# Patient Record
Sex: Female | Born: 1965 | Hispanic: No | Marital: Married | Smoking: Never smoker
Health system: Southern US, Community
[De-identification: ages and names within clinical notes are randomized; demographics above are authoritative.]

## PROBLEM LIST (undated history)

## (undated) DIAGNOSIS — R51 Headache: Secondary | ICD-10-CM

## (undated) DIAGNOSIS — Z8619 Personal history of other infectious and parasitic diseases: Secondary | ICD-10-CM

## (undated) DIAGNOSIS — N90810 Female genital mutilation status, unspecified: Secondary | ICD-10-CM

## (undated) DIAGNOSIS — E785 Hyperlipidemia, unspecified: Secondary | ICD-10-CM

## (undated) DIAGNOSIS — K436 Other and unspecified ventral hernia with obstruction, without gangrene: Secondary | ICD-10-CM

## (undated) DIAGNOSIS — Z9889 Other specified postprocedural states: Secondary | ICD-10-CM

## (undated) DIAGNOSIS — R112 Nausea with vomiting, unspecified: Secondary | ICD-10-CM

## (undated) DIAGNOSIS — I10 Essential (primary) hypertension: Secondary | ICD-10-CM

## (undated) DIAGNOSIS — K5900 Constipation, unspecified: Secondary | ICD-10-CM

## (undated) HISTORY — DX: Personal history of other infectious and parasitic diseases: Z86.19

## (undated) HISTORY — PX: ANAL FISSURE REPAIR: SHX2312

## (undated) HISTORY — DX: Other and unspecified ventral hernia with obstruction, without gangrene: K43.6

## (undated) HISTORY — DX: Female genital mutilation status, unspecified: N90.810

## (undated) HISTORY — DX: Essential (primary) hypertension: I10

---

## 1985-04-30 HISTORY — PX: TONSILLECTOMY AND ADENOIDECTOMY: SUR1326

## 1997-06-25 ENCOUNTER — Ambulatory Visit (HOSPITAL_COMMUNITY): Admission: RE | Admit: 1997-06-25 | Discharge: 1997-06-25 | Payer: Self-pay | Admitting: Obstetrics and Gynecology

## 1998-08-29 ENCOUNTER — Other Ambulatory Visit: Admission: RE | Admit: 1998-08-29 | Discharge: 1998-08-29 | Payer: Self-pay | Admitting: Obstetrics and Gynecology

## 1999-01-12 ENCOUNTER — Emergency Department (HOSPITAL_COMMUNITY): Admission: EM | Admit: 1999-01-12 | Discharge: 1999-01-12 | Payer: Self-pay | Admitting: Emergency Medicine

## 1999-01-12 ENCOUNTER — Encounter: Payer: Self-pay | Admitting: Emergency Medicine

## 1999-07-19 ENCOUNTER — Ambulatory Visit (HOSPITAL_COMMUNITY): Admission: RE | Admit: 1999-07-19 | Discharge: 1999-07-19 | Payer: Self-pay | Admitting: Obstetrics and Gynecology

## 1999-07-19 ENCOUNTER — Encounter: Payer: Self-pay | Admitting: Obstetrics and Gynecology

## 1999-09-05 ENCOUNTER — Encounter: Admission: RE | Admit: 1999-09-05 | Discharge: 1999-09-05 | Payer: Self-pay | Admitting: Family Medicine

## 1999-09-18 ENCOUNTER — Encounter: Admission: RE | Admit: 1999-09-18 | Discharge: 1999-09-18 | Payer: Self-pay | Admitting: Family Medicine

## 2000-03-01 ENCOUNTER — Other Ambulatory Visit: Admission: RE | Admit: 2000-03-01 | Discharge: 2000-03-01 | Payer: Self-pay | Admitting: Obstetrics and Gynecology

## 2000-10-14 ENCOUNTER — Inpatient Hospital Stay (HOSPITAL_COMMUNITY): Admission: AD | Admit: 2000-10-14 | Discharge: 2000-10-14 | Payer: Self-pay | Admitting: Gynecology

## 2000-10-15 ENCOUNTER — Inpatient Hospital Stay (HOSPITAL_COMMUNITY): Admission: AD | Admit: 2000-10-15 | Discharge: 2000-10-20 | Payer: Self-pay | Admitting: *Deleted

## 2000-10-15 ENCOUNTER — Encounter (INDEPENDENT_AMBULATORY_CARE_PROVIDER_SITE_OTHER): Payer: Self-pay

## 2000-11-26 ENCOUNTER — Other Ambulatory Visit: Admission: RE | Admit: 2000-11-26 | Discharge: 2000-11-26 | Payer: Self-pay | Admitting: Gynecology

## 2001-01-20 ENCOUNTER — Other Ambulatory Visit: Admission: RE | Admit: 2001-01-20 | Discharge: 2001-01-20 | Payer: Self-pay | Admitting: Gynecology

## 2002-08-26 ENCOUNTER — Ambulatory Visit (HOSPITAL_COMMUNITY): Admission: RE | Admit: 2002-08-26 | Discharge: 2002-08-26 | Payer: Self-pay | Admitting: Obstetrics and Gynecology

## 2002-08-26 ENCOUNTER — Encounter: Payer: Self-pay | Admitting: Obstetrics and Gynecology

## 2002-12-01 ENCOUNTER — Other Ambulatory Visit: Admission: RE | Admit: 2002-12-01 | Discharge: 2002-12-01 | Payer: Self-pay | Admitting: Obstetrics and Gynecology

## 2003-03-30 ENCOUNTER — Ambulatory Visit (HOSPITAL_COMMUNITY): Admission: RE | Admit: 2003-03-30 | Discharge: 2003-03-30 | Payer: Self-pay | Admitting: Family Medicine

## 2003-05-03 ENCOUNTER — Encounter: Payer: Self-pay | Admitting: Gastroenterology

## 2003-06-16 ENCOUNTER — Other Ambulatory Visit: Admission: RE | Admit: 2003-06-16 | Discharge: 2003-06-16 | Payer: Self-pay | Admitting: Gynecology

## 2003-10-05 ENCOUNTER — Encounter: Admission: RE | Admit: 2003-10-05 | Discharge: 2004-01-03 | Payer: Self-pay | Admitting: Gynecology

## 2004-01-12 ENCOUNTER — Inpatient Hospital Stay (HOSPITAL_COMMUNITY): Admission: AD | Admit: 2004-01-12 | Discharge: 2004-01-16 | Payer: Self-pay | Admitting: Gynecology

## 2004-02-28 ENCOUNTER — Other Ambulatory Visit: Admission: RE | Admit: 2004-02-28 | Discharge: 2004-02-28 | Payer: Self-pay | Admitting: Gynecology

## 2005-03-01 ENCOUNTER — Other Ambulatory Visit: Admission: RE | Admit: 2005-03-01 | Discharge: 2005-03-01 | Payer: Self-pay | Admitting: Obstetrics and Gynecology

## 2006-03-14 ENCOUNTER — Other Ambulatory Visit: Admission: RE | Admit: 2006-03-14 | Discharge: 2006-03-14 | Payer: Self-pay | Admitting: Obstetrics and Gynecology

## 2006-12-10 ENCOUNTER — Encounter: Admission: RE | Admit: 2006-12-10 | Discharge: 2006-12-10 | Payer: Self-pay | Admitting: Surgery

## 2006-12-26 ENCOUNTER — Ambulatory Visit: Payer: Self-pay | Admitting: Gastroenterology

## 2007-07-10 ENCOUNTER — Other Ambulatory Visit: Admission: RE | Admit: 2007-07-10 | Discharge: 2007-07-10 | Payer: Self-pay | Admitting: Obstetrics and Gynecology

## 2007-07-29 DIAGNOSIS — K222 Esophageal obstruction: Secondary | ICD-10-CM | POA: Insufficient documentation

## 2007-07-29 DIAGNOSIS — K219 Gastro-esophageal reflux disease without esophagitis: Secondary | ICD-10-CM | POA: Insufficient documentation

## 2007-08-28 ENCOUNTER — Telehealth: Payer: Self-pay | Admitting: Gastroenterology

## 2007-12-22 ENCOUNTER — Telehealth: Payer: Self-pay | Admitting: Gastroenterology

## 2007-12-22 ENCOUNTER — Ambulatory Visit: Payer: Self-pay | Admitting: Internal Medicine

## 2007-12-22 DIAGNOSIS — R1012 Left upper quadrant pain: Secondary | ICD-10-CM | POA: Insufficient documentation

## 2007-12-23 ENCOUNTER — Telehealth (INDEPENDENT_AMBULATORY_CARE_PROVIDER_SITE_OTHER): Payer: Self-pay

## 2007-12-23 LAB — CONVERTED CEMR LAB
AST: 16 units/L (ref 0–37)
Albumin: 3.8 g/dL (ref 3.5–5.2)
Amylase: 51 units/L (ref 27–131)
BUN: 11 mg/dL (ref 6–23)
Basophils Relative: 1.1 % (ref 0.0–3.0)
Calcium: 8.7 mg/dL (ref 8.4–10.5)
Chloride: 110 meq/L (ref 96–112)
Creatinine, Ser: 0.6 mg/dL (ref 0.4–1.2)
Eosinophils Absolute: 0 10*3/uL (ref 0.0–0.7)
Eosinophils Relative: 0.8 % (ref 0.0–5.0)
GFR calc non Af Amer: 115 mL/min
Glucose, Bld: 101 mg/dL — ABNORMAL HIGH (ref 70–99)
HCT: 34.7 % — ABNORMAL LOW (ref 36.0–46.0)
Lipase: 23 units/L (ref 11.0–59.0)
MCV: 83.5 fL (ref 78.0–100.0)
Monocytes Absolute: 0.4 10*3/uL (ref 0.1–1.0)
Monocytes Relative: 6.4 % (ref 3.0–12.0)
Potassium: 4 meq/L (ref 3.5–5.1)
RBC: 4.16 M/uL (ref 3.87–5.11)
WBC: 5.7 10*3/uL (ref 4.5–10.5)

## 2008-01-09 ENCOUNTER — Ambulatory Visit: Payer: Self-pay | Admitting: Internal Medicine

## 2008-01-11 LAB — CONVERTED CEMR LAB
Ferritin: 45.1 ng/mL (ref 10.0–291.0)
Iron: 52 ug/dL (ref 42–145)

## 2008-01-13 ENCOUNTER — Ambulatory Visit: Payer: Self-pay | Admitting: Gastroenterology

## 2008-01-13 LAB — CONVERTED CEMR LAB
CRP, High Sensitivity: 6 — ABNORMAL HIGH (ref 0.00–5.00)
Sed Rate: 21 mm/hr (ref 0–22)
Tissue Transglutaminase Ab, IgA: 0.4 units (ref <7)

## 2008-01-26 ENCOUNTER — Ambulatory Visit (HOSPITAL_COMMUNITY): Admission: RE | Admit: 2008-01-26 | Discharge: 2008-01-26 | Payer: Self-pay | Admitting: Gastroenterology

## 2008-01-26 DIAGNOSIS — N1330 Unspecified hydronephrosis: Secondary | ICD-10-CM | POA: Insufficient documentation

## 2008-02-02 ENCOUNTER — Encounter: Payer: Self-pay | Admitting: Gastroenterology

## 2008-02-10 ENCOUNTER — Encounter: Payer: Self-pay | Admitting: Gastroenterology

## 2008-07-15 ENCOUNTER — Ambulatory Visit: Payer: Self-pay | Admitting: Obstetrics and Gynecology

## 2008-07-15 ENCOUNTER — Other Ambulatory Visit: Admission: RE | Admit: 2008-07-15 | Discharge: 2008-07-15 | Payer: Self-pay | Admitting: Obstetrics and Gynecology

## 2008-07-15 ENCOUNTER — Encounter: Payer: Self-pay | Admitting: Obstetrics and Gynecology

## 2008-11-04 ENCOUNTER — Ambulatory Visit: Payer: Self-pay | Admitting: Obstetrics and Gynecology

## 2008-11-08 ENCOUNTER — Encounter: Admission: RE | Admit: 2008-11-08 | Discharge: 2008-11-08 | Payer: Self-pay | Admitting: Obstetrics and Gynecology

## 2009-08-17 ENCOUNTER — Emergency Department (HOSPITAL_COMMUNITY): Admission: EM | Admit: 2009-08-17 | Discharge: 2009-08-17 | Payer: Self-pay | Admitting: Family Medicine

## 2009-08-25 ENCOUNTER — Ambulatory Visit: Payer: Self-pay | Admitting: Internal Medicine

## 2009-08-25 ENCOUNTER — Encounter (INDEPENDENT_AMBULATORY_CARE_PROVIDER_SITE_OTHER): Payer: Self-pay | Admitting: Family Medicine

## 2009-08-25 LAB — CONVERTED CEMR LAB
Cortisol, Plasma: 9.5 ug/dL
Helicobacter Pylori Antibody-IgG: 0.5
Vit D, 25-Hydroxy: 12 ng/mL — ABNORMAL LOW (ref 30–89)

## 2009-09-13 ENCOUNTER — Ambulatory Visit: Payer: Self-pay | Admitting: Obstetrics and Gynecology

## 2009-09-13 ENCOUNTER — Other Ambulatory Visit: Admission: RE | Admit: 2009-09-13 | Discharge: 2009-09-13 | Payer: Self-pay | Admitting: Obstetrics and Gynecology

## 2009-10-04 ENCOUNTER — Encounter (INDEPENDENT_AMBULATORY_CARE_PROVIDER_SITE_OTHER): Payer: Self-pay | Admitting: Family Medicine

## 2009-10-04 ENCOUNTER — Ambulatory Visit: Payer: Self-pay | Admitting: Internal Medicine

## 2009-10-04 LAB — CONVERTED CEMR LAB
AST: 13 units/L (ref 0–37)
Albumin: 4.5 g/dL (ref 3.5–5.2)
Alkaline Phosphatase: 68 units/L (ref 39–117)
BUN: 12 mg/dL (ref 6–23)
Basophils Absolute: 0 10*3/uL (ref 0.0–0.1)
Basophils Relative: 1 % (ref 0–1)
Creatinine, Ser: 0.5 mg/dL (ref 0.40–1.20)
Eosinophils Relative: 2 % (ref 0–5)
HCT: 38.6 % (ref 36.0–46.0)
Lymphocytes Relative: 37 % (ref 12–46)
Neutro Abs: 2.9 10*3/uL (ref 1.7–7.7)
Platelets: 294 10*3/uL (ref 150–400)
Potassium: 4.1 meq/L (ref 3.5–5.3)
RDW: 14 % (ref 11.5–15.5)
Total Bilirubin: 0.3 mg/dL (ref 0.3–1.2)

## 2009-10-13 ENCOUNTER — Emergency Department (HOSPITAL_COMMUNITY): Admission: EM | Admit: 2009-10-13 | Discharge: 2009-10-13 | Payer: Self-pay | Admitting: Family Medicine

## 2010-05-21 ENCOUNTER — Encounter: Payer: Self-pay | Admitting: Surgery

## 2010-07-18 LAB — COMPREHENSIVE METABOLIC PANEL
ALT: 15 U/L (ref 0–35)
AST: 20 U/L (ref 0–37)
Alkaline Phosphatase: 73 U/L (ref 39–117)
CO2: 28 mEq/L (ref 19–32)
Chloride: 101 mEq/L (ref 96–112)
GFR calc non Af Amer: >60 mL/min (ref >60)
Glucose, Bld: 88 mg/dL (ref 70–99)
Potassium: 4 mEq/L (ref 3.5–5.1)
Sodium: 138 mEq/L (ref 135–145)
Total Bilirubin: 0.3 mg/dL (ref 0.3–1.2)

## 2010-07-18 LAB — TSH: TSH: 2.141 u[IU]/mL (ref 0.350–4.500)

## 2010-08-15 ENCOUNTER — Inpatient Hospital Stay (INDEPENDENT_AMBULATORY_CARE_PROVIDER_SITE_OTHER)
Admission: RE | Admit: 2010-08-15 | Discharge: 2010-08-15 | Disposition: A | Discharge status: Home / Self Care | Payer: Self-pay | Source: Ambulatory Visit | Attending: Family Medicine | Admitting: Family Medicine

## 2010-08-15 DIAGNOSIS — M26609 Unspecified temporomandibular joint disorder, unspecified side: Secondary | ICD-10-CM

## 2010-08-30 ENCOUNTER — Ambulatory Visit (HOSPITAL_COMMUNITY)
Admission: RE | Admit: 2010-08-30 | Discharge: 2010-08-30 | Disposition: A | Discharge status: Home / Self Care | Payer: Self-pay | Source: Ambulatory Visit | Attending: Family Medicine | Admitting: Family Medicine

## 2010-08-30 ENCOUNTER — Other Ambulatory Visit (HOSPITAL_COMMUNITY): Payer: Self-pay | Admitting: Family Medicine

## 2010-08-30 DIAGNOSIS — R609 Edema, unspecified: Secondary | ICD-10-CM | POA: Insufficient documentation

## 2010-08-30 DIAGNOSIS — R52 Pain, unspecified: Secondary | ICD-10-CM

## 2010-08-30 DIAGNOSIS — M25579 Pain in unspecified ankle and joints of unspecified foot: Secondary | ICD-10-CM | POA: Insufficient documentation

## 2010-09-11 ENCOUNTER — Emergency Department (HOSPITAL_COMMUNITY)
Admission: EM | Admit: 2010-09-11 | Discharge: 2010-09-12 | Disposition: A | Discharge status: Home / Self Care | Payer: Self-pay | Attending: Emergency Medicine | Admitting: Emergency Medicine

## 2010-09-11 ENCOUNTER — Inpatient Hospital Stay (INDEPENDENT_AMBULATORY_CARE_PROVIDER_SITE_OTHER)
Admission: RE | Admit: 2010-09-11 | Discharge: 2010-09-11 | Disposition: A | Discharge status: Home / Self Care | Payer: Self-pay | Source: Ambulatory Visit | Attending: Emergency Medicine | Admitting: Emergency Medicine

## 2010-09-11 DIAGNOSIS — K589 Irritable bowel syndrome without diarrhea: Secondary | ICD-10-CM | POA: Insufficient documentation

## 2010-09-11 DIAGNOSIS — R51 Headache: Secondary | ICD-10-CM

## 2010-09-11 DIAGNOSIS — R209 Unspecified disturbances of skin sensation: Secondary | ICD-10-CM | POA: Insufficient documentation

## 2010-09-12 ENCOUNTER — Emergency Department (HOSPITAL_COMMUNITY): Payer: Self-pay

## 2010-09-12 LAB — BASIC METABOLIC PANEL
CO2: 25 mEq/L (ref 19–32)
Calcium: 9.1 mg/dL (ref 8.4–10.5)
Chloride: 104 mEq/L (ref 96–112)
GFR calc Af Amer: >60 mL/min (ref >60)
Sodium: 139 mEq/L (ref 135–145)

## 2010-09-12 LAB — CBC
Hemoglobin: 11.6 g/dL — ABNORMAL LOW (ref 12.0–15.0)
RBC: 4.24 MIL/uL (ref 3.87–5.11)
WBC: 5.7 10*3/uL (ref 4.0–10.5)

## 2010-09-12 NOTE — Assessment & Plan Note (Signed)
[facility]                         GASTROENTEROLOGY OFFICE NOTE   NAME:Lahey, Jeana A                      MRN:          [id number]  DATE:12/26/2006                            DOB:          March 25, 1966    This patient comes in for Carafate refill which she takes on a p.r.n.  basis for GERD problems when she fasts. She has upcoming religious  holidays and would like to renew her medications. She does have some  mild constipation and I prescribed her fiber supplement. She is  otherwise in good health and denies medical problems.   VITAL SIGNS: Today, were all normal. General physical examination was  not performed.   We will follow her on an outpatient basis as needed. The patient will  need colonoscopy at age 3.     Vania Rea. Jarold Motto, MD, Caleen Essex, FAGA  Electronically Signed    DRP/MedQ  DD: 12/26/2006  DT: 12/26/2006  Job #: [id number]

## 2010-09-15 NOTE — H&P (Signed)
NAME:  DEA, BITTING A                         ACCOUNT NO.:  [id number]   MEDICAL RECORD NO.:  [id number]                   PATIENT TYPE:  INP   LOCATION:  NA                                   FACILITY[facility]  [facility]   PHYSICIAN:  Ivor Costa. Farrel Gobble, M.D.              DATE OF BIRTH:  01/13/1966   DATE OF ADMISSION:  01/13/2004  DATE OF DISCHARGE:                                HISTORY & PHYSICAL   CHIEF COMPLAINT:  40 week pregnancy for elective repeat cesarean section.   HISTORY OF PRESENT ILLNESS:  The patient is a 45 year old, G2, P46 with an  LMP of April 03, 2003, estimated date of confinement is January 13, 2004, estimated gestational age is 40 weeks who had a prior cesarean section  for arrest at 7 cm for an 8 pound 11 ounce baby. The patient had initially  desired a VBAC. An estimated fetal weight done at 36 weeks is consistent  with 6.5 pounds. Based on projected normal growth, the estimated fetal  weight at 40 weeks would be approximately 9, the patient therefore chose to  attempt a VBAC if she went into spontaneous labor and if no labor by 40  weeks would proceed with a repeat cesarean section.  The patient presents  now for cesarean section as she has not gone into labor.  Based on the  estimated fetal weight done last week which was 8 pounds 13 ounces.  Her  pregnancy was complicated by advanced maternal age for which we declined  genetic amniocentesis, hyperprolactinemia and gestational diabetes.   PRENATAL LABS:  She is O positive, antibody negative, RPR nonreactive,  rubella immune, hepatitis B surface antigen nonreactive, HIV nonreactive,  GBS negative.  Please refer to the [facility].   PHYSICAL EXAMINATION:  GENERAL:  She is a well appearing female in no acute  distress.  HEART:  Regular rate.  LUNGS:  Clear to auscultation.  ABDOMEN:  Gravid, soft, nontender with heart tones auscultate.  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 On  vaginal exam, her cervix was long, closed, posterior and  high.  EXTREMITIES:  Trace edema.   ASSESSMENT:  Gestational diabetic, advanced maternal age, previous cesarean  section for repeat.  The patient will present now on the morning of the 15th  for elective repeat cesarean section.                                               Ivor Costa. Farrel Gobble, M.D.    THL/MEDQ  D:  01/12/2004  T:  01/12/2004  Job:  [id number]

## 2010-09-15 NOTE — Op Note (Signed)
NAME:  Hannah Alvarez, Hannah Alvarez                         ACCOUNT NO.:  [id number]   MEDICAL RECORD NO.:  [id number]                   PATIENT TYPE:  INP   LOCATION:  9170                                 [facility]ACILITY:  [facility]   PHYSICIAN:  Ivor Costa. Farrel Gobble, M.D.              DATE OF BIRTH:  06/18/1965   DATE OF PROCEDURE:  01/13/2004  DATE OF DISCHARGE:                                 OPERATIVE REPORT   PREOPERATIVE DIAGNOSIS:  Previous cesarean section and labor for repeat.   POSTOPERATIVE DIAGNOSIS:  Previous cesarean section and labor for repeat.   OPERATION PERFORMED:  Repeat cesarean section, low flap transverse.   SURGEON:  Ivor Costa. Farrel Gobble, M.D.   ASSISTANTMarcial Pacas P. Fontaine, M.D.   ANESTHESIA:  Spinal.   IV FLUIDS:  2300 mL lactated Ringers.   ESTIMATED BLOOD LOSS:  400 mL   URINE OUTPUT:  400 mL clear.   FINDINGS:  Viable female infant in the vertex presentation, meconium by  history, not appreciated at time of cesarean section.  Apgars 9 and 9.  Birth weight 7 pounds 5 ounces.  Normal uterus, tubes and ovaries.   COMPLICATIONS AND PATHOLOGY:  None.   DESCRIPTION OF PROCEDURE:  The patient was taken to the operating room and  spinal anesthesia was induced, placed in supine position with Alvarez left lateral  tilt, prepped and draped in the usual sterile fashion.  Pfannenstiel skin  incision was made with Alvarez scalpel down through the previous cesarean section  scar and carried to the underlying layer of fascia with electrocautery.  The  fascia was scored in the midline.  Incision was extended laterally with Mayo  scissors.  The inferior aspect of the fascial incision was grasped with  Kochers.  The underlying rectus muscles were dissected off with blunt and  sharp dissection.  In Alvarez similar fashion, the superior aspect of the incision  was grasped with Kochers.  The underlying rectus muscles were dissected off.  The rectus muscles were separated in the midline.  The peritoneum was  entered sharply.  The peritoneal incision was entered from superiorly and  inferiorly with good visualization of the underlying bowel and bladder.  Orientation of the uterus confirmed.  The bladder blade was inserted.  The  vesicouterine peritoneum was identified and was opened sharply with the  Metzenbaum scissors.  The bladder flap was created digitally.  The bladder  blade was then reinserted and the lower uterine segment was incised in  transverse fashion with Alvarez scalpel and was noted to be remarkably thin but  intact.  The infant was delivered with the aid of baby Elliots, DeLee  suctioned after delivery of the head and prior to delivery of the remainder  of the body.  Loose nuchal cord was reduced.  The cord was cut and clamped  and handed off to the waiting pediatricians.  Cord bloods were obtained.  The uterus was massaged  and the placenta was allowed to separate naturally.  The uterus was then cleared of all clots and debris.  Uterine incision was  repaired with running locked layer of 0 chromic and noted to be hemostatic  afterwards.  The gutters were cleared of all clots and debris.  The adnexa  were inspected.  Reinspection of the incision assured [place] of hemostasis.  The  peritoneum, fascia and muscle were all noted to be hemostatic.  The fascia  then closed with 0 Vicryl in Alvarez running fashion.  The subcutaneous was  irrigated.  The skin was closed with 4-0 Vicryl on Alvarez Mellody Dance.  The patient  tolerated the procedure well.  Sponge, lap and needle counts correct times  two.                                               Ivor Costa. Farrel Gobble, M.D.    THL/MEDQ  D:  01/13/2004  T:  01/13/2004  Job:  [id number]

## 2010-09-15 NOTE — Op Note (Signed)
[facility]  Patient:    Hannah Alvarez, Hannah Alvarez                      MRN: [id number] Proc. Date: 10/17/00 Adm. Date:  [id number] Attending:  Wetzel Bjornstad                           Operative Report  PREOPERATIVE DIAGNOSES:       1. Term intrauterine pregnancy.                               2. Oligohydramnios.                               3. Meconium-stained amniotic fluid.                               4. Early chorioamnionitis.                               5. Prolonged rupture of membranes.                               6. Arrest of first stage of labor.  POSTOPERATIVE DIAGNOSES:      1. Term intrauterine pregnancy.                               2. Oligohydramnios.                               3. Meconium-stained amniotic fluid.                               4. Early chorioamnionitis.                               5. Prolonged rupture of membranes.                               6. Arrest of first stage of labor.  PROCEDURE PERFORMED:          Primary lower uterine segment transverse cesarean section.  FINDINGS:                     Viable female infant.  Apgars 9 and 9.  Weight 9 lb 11 oz.  Arterial cord pH 7.27.  Nuchal cord x 2.  Normal maternal pelvic anatomy and meconium-stained amniotic fluid.  SURGEON:                      Juan H. Lily Peer, M.D.  ANESTHESIA:                   Epidural.  INDICATIONS FOR OPERATION:    A 45 year old gravida 1, para 0 at 11 weeks estimated gestational age admitted secondary to oligohydramnios.  She advanced to 6-7 cm dilated, -1 station and thick cervix despite  adequate labor pattern. Also, she had received amnio infusion secondary to meconium-stained amniotic fluid.  She started showing signs of early chorioamnionitis and arrest of first stage of labor.  DESCRIPTION OF PROCEDURE:     After the patient was adequately counseled, she was taken to the operating room, where she was placed in the supine  position. Her epidural was redosed.  Her abdomen was prepped and draped in the usual sterile fashion.  A Foley catheter was in place to monitor urinary output.  A Pfannenstiel skin incision was made 2 cm above the symphysis pubis.  The incision was carried down through the skin and subcutaneous tissue down to the rectus fascia, whereby a midline nick was made.  The fascia was incised in a transverse fashion.  The peritoneal cavity was entered cautiously.  A bladder flap was established.  The lower uterine segment was incised in a transverse fashion.  Meconium-stained amniotic fluid was evident.  The newborns head was delivered and, with the use of DeLee suction, the nasopharyngeal area was suctioned.  There was a nuchal cord wrapped around the babys head x 2, which was manually reduced.  The newborn was then delivered.  The cord was doubly clamped and excised.  The infant gave an immediate cry and was passed off to the pediatricians who were in attendance, who gave the above-mentioned parameters.  After cord blood was obtained, the placenta was delivered from the intrauterine cavity and submitted for histologic evaluation.  The uterus was then exteriorized.  The intrauterine cavity was scrubbed clean of any remaining products of conception.  The intrauterine cavity was copiously irrigated with normal saline solution.  After this, the transverse lower uterine segment was reapproximated with a locking stitch and second imbricating layer.  The tubes and ovaries were normal in size, shape and caliber.  The uterus was placed back into the abdominopelvic cavity.  The pelvic cavity was once again irrigated with normal saline solution.  After ascertaining adequate hemostasis, the fascia was closed with 0 Vicryl suture in a running stitch fashion.  The visceral peritoneum was then reapproximated.  Subcutaneous bleeders were Bovie cauterized and the skin was reapproximated with skin clips, followed  by placement of Xeroform gauze and 4 x 4 dressing. The patient was transferred to the recovery room with stable vital signs. Blood loss for the procedure was 1000 cc.  IV fluid was 2300 cc of lactated Ringers.  Urine output was 225 cc.  The patients last dyspnea on exertion of Pen-G was at approximately 2200 hours. DD:  10/17/00 TD:  10/17/00 Job: [id number]

## 2010-09-15 NOTE — Discharge Summary (Signed)
Hannah Alvarez, Hannah Alvarez               ACCOUNT NO.:  [id number]   MEDICAL RECORD NO.:  [id number]          PATIENT TYPE:  INP   LOCATION:  9122                          [facility]ACILITY:  [facility]   PHYSICIAN:  Ivor Costa. Farrel Gobble, M.D. DATE OF BIRTH:  [birth year]   DATE OF ADMISSION:  01/12/2004  DATE OF DISCHARGE:  01/16/2004                                 DISCHARGE SUMMARY   PRINCIPAL DIAGNOSIS:  Term pregnancy.   ADDITIONAL DIAGNOSIS:  Previous cesarean section for repeat, in labor.   PRINCIPAL PROCEDURE:  Repeat cesarean section, low flap transverse.   Refer to the dictated H&P.   HOSPITAL COURSE:  The patient was scheduled for a repeat cesarean section on  January 13, 2004 but presented early that morning in labor.  Initially was  equivocal with regard to the repeat cesarean section but as she made no  cervical change elected to proceed with her scheduled section.  She  underwent a repeat cesarean section under spinal anesthesia with an  estimated blood loss of approximately 400 for delivery of a viable female,  meconium-stained fluid by history, DeLee suctioned.  Apgars 9 and 9, birth  weight 7 pounds 5 ounces, normal tubes and ovaries.  Her postpartum course  was unremarkable.  At the time of discharge the patient was ambulating  without difficulty, tolerating a regular diet, and ready for discharge.  She  was discharged home with a prescription for Tylox one to two q.6h. as needed  for pain, with instructions to follow up in our office 6 weeks postpartum.  The patient was breastfeeding at the time of discharge.   LABORATORY DATA:  Her white count was 7.8, her hemoglobin was 11.1, her  hematocrit was 31.5, and her platelets were 250 postpartum.   CONDITION:  Stable.      THL/MEDQ  D:  01/31/2004  T:  01/31/2004  Job:  [id number]

## 2010-09-15 NOTE — Discharge Summary (Signed)
[facility]  Patient:    POSIE, LILLIBRIDGE                      MRN: [id number] Adm. Date:  [id number] Disc. Date: [id number] Attending:  Wetzel Bjornstad Dictator:   Antony Contras, [facility]                           Discharge Summary  DISCHARGE DIAGNOSES:          1. Intrauterine pregnancy at term.                               2. Oligohydramnios.                               3. Meconium-stained fluid.                               4. Early chorioamnionitis.                               5. Prolonged rupture of membranes.                               6. Arrest of first state of labor.  PROCEDURES:                   Primary low uterine segment transverse cesarean section with delivery of a viable infant.  HISTORY OF PRESENT ILLNESS:   The patient is a 45 year old, gravida 1, para 0-0-0-0, with an LMP of January 09, 2000, [facility] of October 15, 2000. Prenatal risk factors include advanced maternal age.  The patient did decline amniocentesis.  LABORATORY DATA:              Blood type B+.  Antibody screen negative. Sickle cell negative.  RPR, HBSAG, and HIV nonreactive.  Rubella equivocally positive.  MSAP showed an elevated risk of Down syndrome.  GBS was negative.  CBC:  Hematocrit 24.3, hemoglobin 8.5, WBC 14.4, platelets 254.  HOSPITAL COURSE AND TREATMENT:                    The patient presented on October 15, 2000, to the office with a complaint of questionable spontaneous rupture of membranes. Membranes at that time were found to be intact with a negative fern and Nitrazine.  During that exam, it was questionable that the infant was in breech position since the cervix was long, thick, and closed and the presenting part was very high.  An ultrasound was ordered, which did reveal an AFI in less than the third percentile with the fetus in vertex presentation. The patient was admitted for induction of labor secondary to oligohydramnios. Induction was  initiated with Cervidil followed by low-dose Pitocin.  The patient progressed very slowly in labor.  Maximum dilatation was 6-7 cm greater than 24 hours after labor induction was initiated.  The patient also developed a temperature of 100.2 degrees.  Since the cervix was slightly edematous and the patient was not progressing due to adequate labor, it was felt to be prudent to proceed with cesarean section.  The procedure was performed by Lars Mage  Karoline Caldwell, M.D., under epidural anesthesia.  The patient was delivered of an Apgar 9 and 77, female infant weight 8 pounds 11 ounces, nuchal cord x 2, and normal maternal pelvic anatomy.  During the postoperative course, the patient remained afebrile, had no difficulty voiding, and was able to be discharged in satisfactory condition on her third postoperative day.  DISPOSITION:                  Follow up in six weeks.  Continue with prenatal vitamins and iron and Motrin and Tylox for pain. DD:  11/08/00 TD:  11/08/00 Job: [id number]

## 2010-11-07 ENCOUNTER — Inpatient Hospital Stay (INDEPENDENT_AMBULATORY_CARE_PROVIDER_SITE_OTHER)
Admission: RE | Admit: 2010-11-07 | Discharge: 2010-11-07 | Disposition: A | Discharge status: Home / Self Care | Payer: Self-pay | Source: Ambulatory Visit | Attending: Family Medicine | Admitting: Family Medicine

## 2010-11-07 DIAGNOSIS — M26609 Unspecified temporomandibular joint disorder, unspecified side: Secondary | ICD-10-CM

## 2010-11-07 DIAGNOSIS — M62838 Other muscle spasm: Secondary | ICD-10-CM

## 2010-12-11 ENCOUNTER — Other Ambulatory Visit (HOSPITAL_COMMUNITY): Payer: Self-pay | Admitting: Otolaryngology

## 2010-12-11 DIAGNOSIS — M316 Other giant cell arteritis: Secondary | ICD-10-CM

## 2010-12-14 ENCOUNTER — Ambulatory Visit (HOSPITAL_COMMUNITY)
Admission: RE | Admit: 2010-12-14 | Discharge: 2010-12-14 | Disposition: A | Discharge status: Home / Self Care | Payer: Medicaid Other | Source: Ambulatory Visit | Attending: Otolaryngology | Admitting: Otolaryngology

## 2010-12-14 ENCOUNTER — Encounter (HOSPITAL_COMMUNITY): Payer: Self-pay

## 2010-12-14 DIAGNOSIS — M316 Other giant cell arteritis: Secondary | ICD-10-CM

## 2010-12-14 DIAGNOSIS — I776 Arteritis, unspecified: Secondary | ICD-10-CM | POA: Insufficient documentation

## 2010-12-14 DIAGNOSIS — H9209 Otalgia, unspecified ear: Secondary | ICD-10-CM | POA: Insufficient documentation

## 2010-12-14 DIAGNOSIS — R51 Headache: Secondary | ICD-10-CM | POA: Insufficient documentation

## 2010-12-14 DIAGNOSIS — M542 Cervicalgia: Secondary | ICD-10-CM | POA: Insufficient documentation

## 2010-12-14 MED ORDER — IOHEXOL 300 MG/ML  SOLN: 75.0000 mL | Freq: Once | INTRAMUSCULAR | Status: AC | PRN

## 2010-12-22 ENCOUNTER — Ambulatory Visit: Payer: Self-pay | Admitting: Obstetrics and Gynecology

## 2010-12-28 ENCOUNTER — Encounter: Payer: Self-pay | Admitting: Obstetrics and Gynecology

## 2010-12-28 ENCOUNTER — Ambulatory Visit (INDEPENDENT_AMBULATORY_CARE_PROVIDER_SITE_OTHER): Payer: Medicaid Other | Admitting: Obstetrics and Gynecology

## 2010-12-28 DIAGNOSIS — N643 Galactorrhea not associated with childbirth: Secondary | ICD-10-CM

## 2010-12-28 NOTE — Progress Notes (Signed)
Patient came back to see me today because of a return of or galactorrhea. She has had an elevated prolactin the past. She's had 2 normal MRIs. They were done in 2001 and one in 2004.we have treated her both with Parlodel and Dostinex. As a result of this treatment she did conceive previously. She has remained on Dostinex because the galactorrhea wasn't going to work. Last year at [facility] she had a prolactin checked twice and was 20 and 30. She has not had a check since. On the Dostinex 1 tablet twice a week her galactorrhea which reduced but not completely gone. She did however say she was not taking that consistently. She came to see me today because the people at [facility] do not want to give her Dostinex anymore. She is having regular periods off her Dostinex. Although  she's 45 she really would like to get pregnant again. In reviewing her lab work on [facility] this year no prolactin was ordered.  Her breasts were carefully examined and there are no masses but she does have bilateral galactorrhea.  Assessment: Prolactin ordered. Patient will call me for the results. We will probably reinitiate Dostinex after a long discussion with the patient of pros and cons. She is due for a gynecological exam but was on her period today and declined. She will schedule that. We will also need to discuss with her when she had her last mammogram.

## 2011-01-03 ENCOUNTER — Other Ambulatory Visit: Payer: Self-pay | Admitting: Obstetrics and Gynecology

## 2011-01-03 DIAGNOSIS — E221 Hyperprolactinemia: Secondary | ICD-10-CM

## 2011-01-05 NOTE — Progress Notes (Signed)
Addended by: Richardson Chiquito on: 01/05/2011 10:35 AM   Modules accepted: Orders

## 2011-01-10 ENCOUNTER — Ambulatory Visit (HOSPITAL_COMMUNITY)
Admission: RE | Admit: 2011-01-10 | Discharge: 2011-01-10 | Disposition: A | Discharge status: Home / Self Care | Payer: Medicaid Other | Source: Ambulatory Visit | Attending: Obstetrics and Gynecology | Admitting: Obstetrics and Gynecology

## 2011-01-10 DIAGNOSIS — E229 Hyperfunction of pituitary gland, unspecified: Secondary | ICD-10-CM | POA: Insufficient documentation

## 2011-01-10 DIAGNOSIS — E221 Hyperprolactinemia: Secondary | ICD-10-CM

## 2011-01-10 MED ORDER — GADOBENATE DIMEGLUMINE 529 MG/ML IV SOLN
15.0000 mL | Freq: Once | INTRAVENOUS | Status: AC
  Administered 2011-01-10: 15 mL via INTRAVENOUS

## 2011-01-23 ENCOUNTER — Encounter: Payer: Self-pay | Admitting: *Deleted

## 2011-01-23 NOTE — Progress Notes (Signed)
Patient set up for appointment with Dr. Marjory Lies on 02/06/11 @ 10:30.  Patient is aware and records have been sent.

## 2011-02-08 ENCOUNTER — Ambulatory Visit (INDEPENDENT_AMBULATORY_CARE_PROVIDER_SITE_OTHER): Payer: Medicaid Other | Admitting: Obstetrics and Gynecology

## 2011-02-08 ENCOUNTER — Encounter: Payer: Self-pay | Admitting: Obstetrics and Gynecology

## 2011-02-08 ENCOUNTER — Other Ambulatory Visit (HOSPITAL_COMMUNITY)
Admission: RE | Admit: 2011-02-08 | Discharge: 2011-02-08 | Disposition: A | Discharge status: Home / Self Care | Payer: Medicaid Other | Source: Ambulatory Visit | Attending: Obstetrics and Gynecology | Admitting: Obstetrics and Gynecology

## 2011-02-08 VITALS — BP 146/90 | Ht 66.5 in | Wt 151.0 lb

## 2011-02-08 DIAGNOSIS — R1900 Intra-abdominal and pelvic swelling, mass and lump, unspecified site: Secondary | ICD-10-CM

## 2011-02-08 DIAGNOSIS — Z01419 Encounter for gynecological examination (general) (routine) without abnormal findings: Secondary | ICD-10-CM | POA: Insufficient documentation

## 2011-02-08 DIAGNOSIS — D352 Benign neoplasm of pituitary gland: Secondary | ICD-10-CM

## 2011-02-08 DIAGNOSIS — E229 Hyperfunction of pituitary gland, unspecified: Secondary | ICD-10-CM

## 2011-02-08 DIAGNOSIS — Z124 Encounter for screening for malignant neoplasm of cervix: Secondary | ICD-10-CM

## 2011-02-08 MED ORDER — CABERGOLINE 0.5 MG PO TABS: 0.5000 mg | ORAL_TABLET | ORAL | Status: DC

## 2011-02-08 NOTE — Progress Notes (Signed)
Patient came back to see me today for further followup. She had several problems we needed to discuss. The most important one was her galactorrhea. Please see last office notes for complete history. When we saw her last we checked her prolactin. It was 100. She was sent for an MRI. 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 We sent her to the neurologist. He thought this was probably a pituitary microadenoma. He felt that I should treat her for this. She then should have a followup MRI in 6 months which we will order and then we will probably have him see her again. She went to the eye Dr. In September of this year. She had a normal exam except for vitreous prolapse and myopia. I think he was unaware of the above so visual fields were not done. Her second issue is a small mass in her abdomen. I had previously felt it and referred to Dr. Jamey Ripa. He wasn't sure whether was her hernia or a lipoma. At that point she was going to get pregnant again and he felt it could be managed at the time of her cesarean. She has not conceived and it bothers her enough now that she would want to consider surgical removal. He was going to do a CT for diagnosis but that has not been done. She is due for a mammogram.  12 point review of systems done: Pertinent positives listed above.  Physical examination: HEENT within normal limits. Neck: Thyroid not large. No masses. Supraclavicular nodes: not enlarged. Breasts: Examined in both sitting midline position. No skin changes and no masses.obvious bilateral galactorrhea. Abdomen: Soft no guarding rebound. Three to four cm mass in midline below the umbilicus Pelvic: External: Within normal limits. BUS: Within normal limits. Vaginal:within normal limits. Good estrogen effect. No evidence of cystocele rectocele or enterocele. Cervix: clean. Uterus: Normal size and shape. Adnexa: No masses. Rectovaginal exam: Confirmatory and negative. Extremities: Within normal limits.    Assessment: 1. Pituitary microadenoma with galactorrhea. 2. Abdominal mass unchanged from previous exams.   Plan: Started the patient on Dostinex one pill BIW. Previously we have started on half a pill but had to increase to a full pills. to reduce the prolactin level. Recheck prolactin 8 weeks. We will send her back to the eye Dr. For visual fields. Repeat MRI in 6 months. Mammogram. Surgical consult with Dr. Jamey Ripa.

## 2011-02-09 ENCOUNTER — Telehealth: Payer: Self-pay | Admitting: *Deleted

## 2011-02-09 NOTE — Telephone Encounter (Signed)
Called Dr. Jilda Roche office to set up visual fields for patient.  They said that Dr. Randon Goldsmith office does that for them.  So I called them and then they said they don't take [facility].  So I called patient and told her the above and told her to call Dr. Jilda Roche office to have them find her a office that could do it that accepts her insurance and will let us know if we need to do anything else on our end.

## 2011-03-02 ENCOUNTER — Telehealth: Payer: Self-pay

## 2011-03-02 NOTE — Telephone Encounter (Signed)
Patient asked me to call [facility] regarding her generic Dostinex RX. She said it is too expensive and she wants to get it at [facility].  I called [facility] and they said Dr. Clelia Croft would be happy to RX it there for pt but would need "clear instructions on how to monitor the condition" from Dr. Eda Paschal.  Then the lady at [facility] tells me that they are not going to be able to prescribe for pt because she has Medicaid and  A second ins.  I called patient and told her what they said. She denies a 2nd ins and I told her she could call [facility] and straighten that out and let us know if they needed info from Dr. Reece Agar re: recommended followup.

## 2011-04-12 ENCOUNTER — Ambulatory Visit (INDEPENDENT_AMBULATORY_CARE_PROVIDER_SITE_OTHER): Payer: Medicaid Other | Admitting: Obstetrics and Gynecology

## 2011-04-12 DIAGNOSIS — E229 Hyperfunction of pituitary gland, unspecified: Secondary | ICD-10-CM

## 2011-04-12 DIAGNOSIS — E221 Hyperprolactinemia: Secondary | ICD-10-CM

## 2011-07-03 ENCOUNTER — Telehealth: Payer: Self-pay | Admitting: *Deleted

## 2011-07-03 NOTE — Telephone Encounter (Signed)
Pt called stating that her Dostinex 0.5 mg was too expensive with her insurance. I called the [facility] and the pharmacist said that it was $200 because her insurance which is family planning medicaid. Pt said that she couldn't afford medication, I asked pt to check with her social worker to see if they had any type of assistant program to help her pay for medication. Pt said she will double check with worker.

## 2011-07-18 ENCOUNTER — Telehealth: Payer: Self-pay | Admitting: *Deleted

## 2011-07-18 MED ORDER — BROMOCRIPTINE MESYLATE 2.5 MG PO TABS: 2.5000 mg | ORAL_TABLET | Freq: Every day | ORAL | Status: DC

## 2011-07-18 NOTE — Telephone Encounter (Signed)
Left the below message on pt vm, placed rx up front for pick-up, recall letter in computer for 10 prolactin level recheck.

## 2011-07-18 NOTE — Telephone Encounter (Signed)
Okay. She she start one pill a day. She needs prolactin check in 10 weeks.

## 2011-07-18 NOTE — Telephone Encounter (Signed)
Pt called requesting Rx for parlodel 2.5 mg daily rather than her Dostinex 0.5 mg daily. Dostinex medication is too expensive $200.00 and this medication is not covered. Pt said that you spoke about this medication with her before and said it would be much cheaper. If okay pt will need [facility]written [facility] to [facility] for [facility] to fill. Please advise

## 2011-08-21 ENCOUNTER — Encounter (INDEPENDENT_AMBULATORY_CARE_PROVIDER_SITE_OTHER): Payer: Self-pay | Admitting: Surgery

## 2011-08-21 ENCOUNTER — Other Ambulatory Visit (HOSPITAL_COMMUNITY): Payer: Self-pay | Admitting: Family Medicine

## 2011-08-28 ENCOUNTER — Ambulatory Visit (INDEPENDENT_AMBULATORY_CARE_PROVIDER_SITE_OTHER): Payer: PRIVATE HEALTH INSURANCE | Admitting: Surgery

## 2011-08-28 ENCOUNTER — Encounter (INDEPENDENT_AMBULATORY_CARE_PROVIDER_SITE_OTHER): Payer: Self-pay | Admitting: Surgery

## 2011-08-28 VITALS — BP 130/88 | HR 80 | Resp 16 | Ht 67.0 in | Wt 159.0 lb

## 2011-08-28 DIAGNOSIS — K436 Other and unspecified ventral hernia with obstruction, without gangrene: Secondary | ICD-10-CM

## 2011-08-28 HISTORY — DX: Other and unspecified ventral hernia with obstruction, without gangrene: K43.6

## 2011-08-28 NOTE — Patient Instructions (Signed)
We will schedule you for surgery to repair the hernia below your umbilicus (belly-button).

## 2011-08-28 NOTE — Progress Notes (Signed)
Patient ID: Hannah Alvarez, female   DOB: 04-04-1966, 46 y.o.   MRN: [id number]  Chief Complaint  Patient presents with  . Hernia    HPI Hannah Alvarez is a 46 y.o. female.  She has had a hernia below her umbilicus for many years. She thinks it first developed after her first C-section. She had another C-section a few years later and seemed to get bigger after that. She was seen in our office in 2008 for this and a CT scan showed a small infraumbilical incision. She is asymptomatic at that time and no surgery was done.  In the interval she thinks it has become somewhat bigger. She has pain from time to time and she is also having some abdominal symptoms from time to time. She is asymptomatic today when she came in the office. She's not have nausea vomiting fevers chills diarrhea or constipation. HPI  Past Medical History  Diagnosis Date  . Prolactin increased   . Female circumcision   . Hypertension     Past Surgical History  Procedure Date  . Tonsillectomy and adenoidectomy 1987  . Cesarean section 2002/2005    X 2    Family History  Problem Relation Age of Onset  . Hypertension Mother     Social History History  Substance Use Topics  . Smoking status: Never Smoker   . Smokeless tobacco: Never Used  . Alcohol Use: No    Allergies  Allergen Reactions  . Aspirin     Current Outpatient Prescriptions  Medication Sig Dispense Refill  . Calcium Carb-Cholecalciferol (CALCIUM 1000 + D PO) Take 1,000 Units by mouth daily at 8 pm.      . bromocriptine (PARLODEL) 2.5 MG tablet Take 2.5 mg by mouth daily.        . cabergoline (DOSTINEX) 0.5 MG tablet Take 1 tablet (0.5 mg total) by mouth 2 (two) times a week.  10 tablet  12  . Cetirizine HCl (ZYRTEC PO) Take by mouth.        . dicyclomine (BENTYL) 10 MG capsule Take 10 mg by mouth daily.        Marland Kitchen loratadine (CLARITIN) 10 MG tablet Take 10 mg by mouth daily.        Marland Kitchen DISCONTD: bromocriptine (PARLODEL) 2.5 MG tablet Take 1  tablet (2.5 mg total) by mouth daily.  30 tablet  6    Review of Systems Review of Systems  Constitutional: Negative for fever, chills and unexpected weight change.  HENT: Negative for hearing loss, congestion, sore throat, trouble swallowing and voice change.   Eyes: Negative for visual disturbance.  Respiratory: Negative for cough and wheezing.   Cardiovascular: Negative for chest pain, palpitations and leg swelling.  Gastrointestinal: Positive for abdominal distention. Negative for nausea, vomiting, abdominal pain, diarrhea, constipation, blood in stool and anal bleeding.  Genitourinary: Negative for hematuria, vaginal bleeding and difficulty urinating.  Musculoskeletal: Negative for arthralgias.  Skin: Negative for rash and wound.  Neurological: Negative for seizures, syncope and headaches.  Hematological: Negative for adenopathy. Does not bruise/bleed easily.  Psychiatric/Behavioral: Negative for confusion.    Blood pressure 130/88, pulse 80, resp. rate 16, height 5\' 7"  (1.702 m), weight 159 lb (72.122 kg).  Physical Exam Physical Exam  Vitals reviewed. Constitutional: She is oriented to person, place, and time. She appears well-developed and well-nourished. No distress.  HENT:  Head: Normocephalic and atraumatic.  Mouth/Throat: Oropharynx is clear and moist.  Eyes: Conjunctivae and EOM are normal. Pupils are equal,  round, and reactive to light. No scleral icterus.  Neck: Normal range of motion. Neck supple. No tracheal deviation present. No thyromegaly present.  Cardiovascular: Normal rate, regular rhythm, normal heart sounds and intact distal pulses.  Exam reveals no gallop and no friction rub.   No murmur heard. Pulmonary/Chest: Effort normal and breath sounds normal. No respiratory distress. She has no wheezes. She has no rales.  Abdominal: Soft. Bowel sounds are normal. She exhibits no distension and no mass. There is no tenderness. There is no rebound and no guarding.        Partially reducible hernia about two or three inches below the umbilicus. It is not tender and she notes that is unchanged today from its usual.  Musculoskeletal: Normal range of motion. She exhibits no edema and no tenderness.  Neurological: She is alert and oriented to person, place, and time.  Skin: Skin is warm and dry. No rash noted. She is not diaphoretic. No erythema.  Psychiatric: She has a normal mood and affect. Her behavior is normal. Judgment and thought content normal.    Data Reviewed I have reviewed this with her primary care physician as well as some nodes in the electronic medical record plus review of her CT scan from several years ago. That scan did show a small infraumbilical hernia containing only fat.  Assessment    Partially reducible (therefore incarcerated) ventral hernia, likely related to prior c-sections. Defect I believe is relatively small.    Plan    Will go ahead and schedule for repair. I believe it is small enough that we can do as outpatient. It will require mesh. Discussed with her including risks etc. She would like to proceed.       Logyn Kendrick J 08/28/2011, 4:07 PM   Cc: Dr Oletha Blend

## 2011-08-31 ENCOUNTER — Other Ambulatory Visit (HOSPITAL_COMMUNITY): Payer: Self-pay | Admitting: Family Medicine

## 2011-08-31 ENCOUNTER — Ambulatory Visit (HOSPITAL_COMMUNITY)
Admission: RE | Admit: 2011-08-31 | Discharge: 2011-08-31 | Disposition: A | Discharge status: Home / Self Care | Payer: Self-pay | Source: Ambulatory Visit | Attending: Family Medicine | Admitting: Family Medicine

## 2011-08-31 DIAGNOSIS — K824 Cholesterolosis of gallbladder: Secondary | ICD-10-CM | POA: Insufficient documentation

## 2011-08-31 DIAGNOSIS — R109 Unspecified abdominal pain: Secondary | ICD-10-CM | POA: Insufficient documentation

## 2011-08-31 DIAGNOSIS — K8020 Calculus of gallbladder without cholecystitis without obstruction: Secondary | ICD-10-CM | POA: Insufficient documentation

## 2011-09-05 ENCOUNTER — Telehealth: Payer: Self-pay | Admitting: *Deleted

## 2011-09-05 NOTE — Telephone Encounter (Signed)
I   Would do whatever Dr. Jamey Ripa said. What did he tell her to do?

## 2011-09-05 NOTE — Telephone Encounter (Signed)
Pt saw Dr.Christian Streck general surgery on 08/28/11 er hernia below her umbilicus. Pt would like your opinion if you think she should get this removed? Please advise

## 2011-09-05 NOTE — Telephone Encounter (Signed)
Pt was told to follow thru with removal repair, pt will do as directed.

## 2011-09-19 ENCOUNTER — Emergency Department (INDEPENDENT_AMBULATORY_CARE_PROVIDER_SITE_OTHER)
Admission: EM | Admit: 2011-09-19 | Discharge: 2011-09-19 | Disposition: A | Discharge status: Home / Self Care | Payer: Self-pay | Source: Home / Self Care | Attending: Emergency Medicine | Admitting: Emergency Medicine

## 2011-09-19 ENCOUNTER — Encounter (HOSPITAL_COMMUNITY): Payer: Self-pay

## 2011-09-19 DIAGNOSIS — S7000XA Contusion of unspecified hip, initial encounter: Secondary | ICD-10-CM

## 2011-09-19 DIAGNOSIS — S0990XA Unspecified injury of head, initial encounter: Secondary | ICD-10-CM

## 2011-09-19 MED ORDER — HYDROCODONE-ACETAMINOPHEN 5-500 MG PO TABS: 1.0000 | ORAL_TABLET | Freq: Four times a day (QID) | ORAL | Status: DC | PRN

## 2011-09-19 NOTE — ED Notes (Addendum)
States that yesterday, she slipped on wet floor in home, struck her head w/o LOC;  C/o lump on right posterior aspect of scalp, both wrists painful (no deformity noted) pain right hip (ambulatory w/o observable difficulty) Denies LOC

## 2011-09-19 NOTE — ED Provider Notes (Signed)
History     CSN: [id number]  Arrival date & time 09/19/11  0909   First MD Initiated Contact with Patient 09/19/11 [phone]      Chief Complaint  Patient presents with  . Fall    (Consider location/radiation/quality/duration/timing/severity/associated sxs/prior treatment) HPI Comments: Patient slipped yesterday at home in the right side of her head and her right hip, with bilateral wrist pain. This is sore on her head (points to right parietal region), did not breaker skin nor any bleeding is minimal swelling in the area. She did apply some ice yesterday. Is concerned because it was still tender at touch in the same area. Also having some discomfort in her right hip points to lateral aspect of right upper leg. Patient denies any weakness, paresthesias or numbness. Denies any vomiting, nausea visual changes, or feeling drowsy or sleepy. He did not lose consciousness and recalls every single moment from the event. She also that she has any fractures she feels she is bruises.  Patient is a 46 y.o. female presenting with fall. The history is provided by the patient.  Fall The accident occurred yesterday. The fall occurred while walking. She fell from a height of 1 to 2 ft. There was no blood loss. The point of impact was the head, right hip and left wrist. The pain is present in the head. The pain is at a severity of 6/10. The pain is moderate. She was ambulatory at the scene. There was no entrapment after the fall. There was no alcohol use involved in the accident. Pertinent negatives include no visual change, no fever, no numbness, no bowel incontinence, no vomiting, no headaches, no loss of consciousness and no tingling. The symptoms are aggravated by activity. She has tried nothing for the symptoms.    Past Medical History  Diagnosis Date  . Prolactin increased   . Female circumcision   . Hypertension     Past Surgical History  Procedure Date  . Tonsillectomy and adenoidectomy 1987  .  Cesarean section 2002/2005    X 2    Family History  Problem Relation Age of Onset  . Hypertension Mother     History  Substance Use Topics  . Smoking status: Never Smoker   . Smokeless tobacco: Never Used  . Alcohol Use: No    OB History    Grav Para Term Preterm Abortions TAB SAB Ect Mult Living   4 2 2  2  2   2       Review of Systems  Constitutional: Negative for fever, chills and activity change.  Gastrointestinal: Negative for vomiting and bowel incontinence.  Musculoskeletal: Positive for arthralgias. Negative for joint swelling and gait problem.  Neurological: Negative for dizziness, tingling, tremors, loss of consciousness, speech difficulty, weakness, numbness and headaches.    Allergies  Aspirin  Home Medications   Current Outpatient Rx  Name Route Sig Dispense Refill  . BROMOCRIPTINE MESYLATE 2.5 MG PO TABS Oral Take 2.5 mg by mouth daily.      Marland Kitchen CALCIUM 1000 + D PO Oral Take 1,000 Units by mouth daily at 8 pm.    . OMEGA-3 FATTY ACIDS 1000 MG PO CAPS Oral Take 2 g by mouth daily.    Marland Kitchen HYDROCHLOROTHIAZIDE 25 MG PO TABS Oral Take 25 mg by mouth daily.    . CABERGOLINE 0.5 MG PO TABS Oral Take 1 tablet (0.5 mg total) by mouth 2 (two) times a week. 10 tablet 12  . ZYRTEC PO Oral Take by  mouth.     Marland Kitchen DICYCLOMINE HCL 10 MG PO CAPS Oral Take 10 mg by mouth daily.      Marland Kitchen HYDROCODONE-ACETAMINOPHEN 5-500 MG PO TABS Oral Take 1-2 tablets by mouth every 6 (six) hours as needed for pain. 15 tablet 0  . LORATADINE 10 MG PO TABS Oral Take 10 mg by mouth daily.        BP 135/87  Pulse 72  Temp(Src) 98.6 F (37 C) (Oral)  Resp 16  SpO2 100%  LMP 09/12/2011  Physical Exam  Nursing note and vitals reviewed. Constitutional: She appears well-developed and well-nourished.  Non-toxic appearance. She does not have a sickly appearance. She does not appear ill. No distress.    HENT:  Head: Normocephalic.  Eyes: Conjunctivae are normal.  Neck: Neck supple.    Pulmonary/Chest: Breath sounds normal.  Musculoskeletal: Normal range of motion. She exhibits tenderness.  Neurological: She is alert. She displays normal reflexes. No cranial nerve deficit. She exhibits normal muscle tone. Coordination normal.  Skin: No erythema.    ED Course  Procedures (including critical care time)  Labs Reviewed - No data to display No results found.   1. Head injury, closed, initial encounter   2. Contusion, hip and thigh       MDM  Patient presents urgent care 24 hours after having sustained a fall at home in which she hit right parietal region along with a right-sided hip sprain bilateral wrist contusions and sprains. Patient is neurologically intact we discuss symptoms or further evaluation emergency department. She had other concerns about her recently prescribed and he did demonstrate treatments.       Jimmie Molly, MD 09/19/11 [phone]

## 2011-09-28 ENCOUNTER — Encounter (HOSPITAL_COMMUNITY): Payer: Self-pay | Admitting: *Deleted

## 2011-09-28 ENCOUNTER — Other Ambulatory Visit (HOSPITAL_COMMUNITY): Payer: Self-pay

## 2011-10-01 ENCOUNTER — Encounter (HOSPITAL_COMMUNITY): Payer: Self-pay | Admitting: Pharmacy Technician

## 2011-10-02 ENCOUNTER — Encounter (HOSPITAL_COMMUNITY)
Admit: 2011-10-02 | Discharge: 2011-10-02 | Disposition: A | Discharge status: Home / Self Care | Payer: Self-pay | Attending: Surgery | Admitting: Surgery

## 2011-10-02 ENCOUNTER — Encounter (HOSPITAL_COMMUNITY)
Admission: RE | Admit: 2011-10-02 | Discharge: 2011-10-02 | Disposition: A | Discharge status: Home / Self Care | Payer: Self-pay | Source: Ambulatory Visit | Attending: Surgery | Admitting: Surgery

## 2011-10-02 LAB — BASIC METABOLIC PANEL
CO2: 28 mEq/L (ref 19–32)
Calcium: 9.4 mg/dL (ref 8.4–10.5)
Creatinine, Ser: 0.55 mg/dL (ref 0.50–1.10)
Glucose, Bld: 105 mg/dL — ABNORMAL HIGH (ref 70–99)

## 2011-10-02 LAB — CBC
HCT: 36.3 % (ref 36.0–46.0)
MCH: 27.9 pg (ref 26.0–34.0)
MCV: 83.8 fL (ref 78.0–100.0)
Platelets: 311 10*3/uL (ref 150–400)
RBC: 4.33 MIL/uL (ref 3.87–5.11)

## 2011-10-02 LAB — HCG, SERUM, QUALITATIVE: Preg, Serum: NEGATIVE

## 2011-10-02 NOTE — Pre-Procedure Instructions (Signed)
18 Hannah Alvarez  10/02/2011   Your procedure is scheduled on:  10/09/11  Report to [facility] at 530 AM.  Call this number if you have problems the morning of surgery: [phone]   Remember:   Do not eat food:After Midnight.  May have clear liquids: up to 4 Hours before arrival.  Clear liquids include soda, tea, black coffee, apple or grape juice, broth.  Take these medicines the morning of surgery with A SIP OF WATER: zyrtec,parlodel   Do not wear jewelry, make-up or nail polish.  Do not wear lotions, powders, or perfumes. You may wear deodorant.  Do not shave 48 hours prior to surgery. Men may shave face and neck.  Do not bring valuables to the hospital.  Contacts, dentures or bridgework may not be worn into surgery.  Leave suitcase in the car. After surgery it may be brought to your room.  For patients admitted to the hospital, checkout time is 11:00 AM the day of discharge.   Patients discharged the day of surgery will not be allowed to drive home.  Name and phone number of your driver: family  Special Instructions: CHG Shower Use Special Wash: 1/2 bottle night before surgery and 1/2 bottle morning of surgery.   Please read over the following fact sheets that you were given: Pain Booklet, Coughing and Deep Breathing, MRSA Information and Surgical Site Infection Prevention

## 2011-10-08 MED ORDER — CHLORHEXIDINE GLUCONATE 4 % EX LIQD: 1.0000 "application " | Freq: Once | CUTANEOUS | Status: DC

## 2011-10-08 MED ORDER — CEFAZOLIN SODIUM 1-5 GM-% IV SOLN
1.0000 g | INTRAVENOUS | Status: AC
  Administered 2011-10-09: 1 g via INTRAVENOUS

## 2011-10-09 ENCOUNTER — Encounter (HOSPITAL_COMMUNITY): Payer: Self-pay | Admitting: Anesthesiology

## 2011-10-09 ENCOUNTER — Ambulatory Visit (HOSPITAL_COMMUNITY): Payer: Self-pay | Admitting: Anesthesiology

## 2011-10-09 ENCOUNTER — Telehealth (INDEPENDENT_AMBULATORY_CARE_PROVIDER_SITE_OTHER): Payer: Self-pay | Admitting: General Surgery

## 2011-10-09 ENCOUNTER — Ambulatory Visit (HOSPITAL_COMMUNITY)
Admission: RE | Admit: 2011-10-09 | Discharge: 2011-10-09 | Disposition: A | Discharge status: Home / Self Care | Payer: Self-pay | Source: Ambulatory Visit | Attending: Surgery | Admitting: Surgery

## 2011-10-09 ENCOUNTER — Encounter (HOSPITAL_COMMUNITY)
Admission: RE | Disposition: A | Discharge status: Home / Self Care | Payer: Self-pay | Source: Ambulatory Visit | Attending: Surgery

## 2011-10-09 ENCOUNTER — Encounter (HOSPITAL_COMMUNITY): Payer: Self-pay | Admitting: *Deleted

## 2011-10-09 DIAGNOSIS — R51 Headache: Secondary | ICD-10-CM | POA: Insufficient documentation

## 2011-10-09 DIAGNOSIS — I10 Essential (primary) hypertension: Secondary | ICD-10-CM | POA: Insufficient documentation

## 2011-10-09 DIAGNOSIS — K436 Other and unspecified ventral hernia with obstruction, without gangrene: Secondary | ICD-10-CM | POA: Insufficient documentation

## 2011-10-09 DIAGNOSIS — K219 Gastro-esophageal reflux disease without esophagitis: Secondary | ICD-10-CM | POA: Insufficient documentation

## 2011-10-09 HISTORY — DX: Headache: R51

## 2011-10-09 HISTORY — DX: Other specified postprocedural states: Z98.890

## 2011-10-09 HISTORY — DX: Other specified postprocedural states: R11.2

## 2011-10-09 HISTORY — PX: VENTRAL HERNIA REPAIR: SHX424

## 2011-10-09 HISTORY — DX: Constipation, unspecified: K59.00

## 2011-10-09 SURGERY — REPAIR, HERNIA, VENTRAL
Anesthesia: General | Site: Abdomen | Wound class: Clean

## 2011-10-09 MED ORDER — BUPIVACAINE HCL 0.25 % IJ SOLN
INTRAMUSCULAR | Status: DC | PRN
  Administered 2011-10-09: 30 mL

## 2011-10-09 MED ORDER — ROCURONIUM BROMIDE 100 MG/10ML IV SOLN
INTRAVENOUS | Status: DC | PRN
  Administered 2011-10-09: 30 mg via INTRAVENOUS

## 2011-10-09 MED ORDER — PROPOFOL 10 MG/ML IV EMUL
INTRAVENOUS | Status: DC | PRN
  Administered 2011-10-09: 165 mL via INTRAVENOUS

## 2011-10-09 MED ORDER — HYDROMORPHONE HCL PF 1 MG/ML IJ SOLN
INTRAMUSCULAR | Status: AC
  Filled 2011-10-09: qty 1

## 2011-10-09 MED ORDER — CEFAZOLIN SODIUM 1-5 GM-% IV SOLN
INTRAVENOUS | Status: AC
  Filled 2011-10-09: qty 50

## 2011-10-09 MED ORDER — ACETAMINOPHEN 10 MG/ML IV SOLN
INTRAVENOUS | Status: DC | PRN
  Administered 2011-10-09: 1000 mg via INTRAVENOUS

## 2011-10-09 MED ORDER — NEOSTIGMINE METHYLSULFATE 1 MG/ML IJ SOLN
INTRAMUSCULAR | Status: DC | PRN
  Administered 2011-10-09: 3 mg via INTRAVENOUS

## 2011-10-09 MED ORDER — LACTATED RINGERS IV SOLN
INTRAVENOUS | Status: DC | PRN
  Administered 2011-10-09: 07:00:00 via INTRAVENOUS

## 2011-10-09 MED ORDER — FENTANYL CITRATE 0.05 MG/ML IJ SOLN
INTRAMUSCULAR | Status: DC | PRN
  Administered 2011-10-09: 100 ug via INTRAVENOUS
  Administered 2011-10-09: 50 ug via INTRAVENOUS

## 2011-10-09 MED ORDER — GLYCOPYRROLATE 0.2 MG/ML IJ SOLN
INTRAMUSCULAR | Status: DC | PRN
  Administered 2011-10-09: 0.4 mg via INTRAVENOUS

## 2011-10-09 MED ORDER — DEXAMETHASONE SODIUM PHOSPHATE 10 MG/ML IJ SOLN
INTRAMUSCULAR | Status: DC | PRN
  Administered 2011-10-09: 4 mg via INTRAVENOUS

## 2011-10-09 MED ORDER — OXYCODONE-ACETAMINOPHEN 5-325 MG PO TABS: 1.0000 | ORAL_TABLET | ORAL | Status: AC | PRN

## 2011-10-09 MED ORDER — HYDROMORPHONE HCL PF 1 MG/ML IJ SOLN
0.2500 mg | INTRAMUSCULAR | Status: DC | PRN
  Administered 2011-10-09: 0.25 mg via INTRAVENOUS

## 2011-10-09 MED ORDER — LIDOCAINE HCL (CARDIAC) 20 MG/ML IV SOLN
INTRAVENOUS | Status: DC | PRN
  Administered 2011-10-09: 60 mg via INTRAVENOUS

## 2011-10-09 MED ORDER — MIDAZOLAM HCL 5 MG/5ML IJ SOLN
INTRAMUSCULAR | Status: DC | PRN
  Administered 2011-10-09: 1 mg via INTRAVENOUS

## 2011-10-09 MED ORDER — LIDOCAINE HCL 4 % MT SOLN
OROMUCOSAL | Status: DC | PRN
  Administered 2011-10-09: 4 mL via TOPICAL

## 2011-10-09 MED ORDER — 0.9 % SODIUM CHLORIDE (POUR BTL) OPTIME
TOPICAL | Status: DC | PRN
  Administered 2011-10-09: 1000 mL

## 2011-10-09 MED ORDER — DEXTROSE 5 % IV SOLN
INTRAVENOUS | Status: DC | PRN
  Administered 2011-10-09 (×2): via INTRAVENOUS

## 2011-10-09 MED ORDER — ONDANSETRON HCL 4 MG/2ML IJ SOLN: 4.0000 mg | Freq: Once | INTRAMUSCULAR | Status: DC | PRN

## 2011-10-09 MED ORDER — ONDANSETRON HCL 4 MG/2ML IJ SOLN
INTRAMUSCULAR | Status: DC | PRN
  Administered 2011-10-09: 4 mg via INTRAVENOUS

## 2011-10-09 MED ORDER — ACETAMINOPHEN 10 MG/ML IV SOLN
INTRAVENOUS | Status: AC
  Filled 2011-10-09: qty 100

## 2011-10-09 SURGICAL SUPPLY — 46 items
ADH SKN CLS APL DERMABOND .7 (GAUZE/BANDAGES/DRESSINGS) ×1
BLADE SURG ROTATE 9660 (MISCELLANEOUS) IMPLANT
CANISTER SUCTION 2500CC (MISCELLANEOUS) ×2 IMPLANT
CHLORAPREP W/TINT 26ML (MISCELLANEOUS) ×2 IMPLANT
CLOTH BEACON ORANGE TIMEOUT ST (SAFETY) ×2 IMPLANT
COVER SURGICAL LIGHT HANDLE (MISCELLANEOUS) ×2 IMPLANT
DERMABOND ADVANCED (GAUZE/BANDAGES/DRESSINGS) ×1
DERMABOND ADVANCED .7 DNX12 (GAUZE/BANDAGES/DRESSINGS) IMPLANT
DRAPE LAPAROSCOPIC ABDOMINAL (DRAPES) ×2 IMPLANT
DRAPE UTILITY 15X26 W/TAPE STR (DRAPE) ×4 IMPLANT
ELECT CAUTERY BLADE 6.4 (BLADE) ×2 IMPLANT
ELECT REM PT RETURN 9FT ADLT (ELECTROSURGICAL) ×2
ELECTRODE REM PT RTRN 9FT ADLT (ELECTROSURGICAL) ×1 IMPLANT
GLOVE BIOGEL PI IND STRL 6.5 (GLOVE) IMPLANT
GLOVE BIOGEL PI IND STRL 7.0 (GLOVE) IMPLANT
GLOVE BIOGEL PI IND STRL 7.5 (GLOVE) IMPLANT
GLOVE BIOGEL PI INDICATOR 6.5 (GLOVE) ×1
GLOVE BIOGEL PI INDICATOR 7.0 (GLOVE) ×1
GLOVE BIOGEL PI INDICATOR 7.5 (GLOVE) ×1
GLOVE EUDERMIC 7 POWDERFREE (GLOVE) ×2 IMPLANT
GLOVE SURG SS PI 7.0 STRL IVOR (GLOVE) ×3 IMPLANT
GOWN BRE IMP SLV AUR LG STRL (GOWN DISPOSABLE) ×1 IMPLANT
GOWN PREVENTION PLUS XLARGE (GOWN DISPOSABLE) ×2 IMPLANT
GOWN STRL NON-REIN LRG LVL3 (GOWN DISPOSABLE) ×4 IMPLANT
KIT BASIN OR (CUSTOM PROCEDURE TRAY) ×2 IMPLANT
KIT ROOM TURNOVER OR (KITS) ×2 IMPLANT
MESH HERNIA 3X6 (Mesh General) ×1 IMPLANT
NDL HYPO 25GX1X1/2 BEV (NEEDLE) IMPLANT
NEEDLE HYPO 25GX1X1/2 BEV (NEEDLE) ×2 IMPLANT
NS IRRIG 1000ML POUR BTL (IV SOLUTION) ×2 IMPLANT
PACK GENERAL/GYN (CUSTOM PROCEDURE TRAY) ×2 IMPLANT
PAD ARMBOARD 7.5X6 YLW CONV (MISCELLANEOUS) ×3 IMPLANT
SPONGE GAUZE 4X4 12PLY (GAUZE/BANDAGES/DRESSINGS) IMPLANT
SPONGE LAP 4X18 X RAY DECT (DISPOSABLE) ×1 IMPLANT
STAPLER VISISTAT 35W (STAPLE) IMPLANT
SUT MNCRL AB 4-0 PS2 18 (SUTURE) ×2 IMPLANT
SUT PROLENE 0 CT 1 CR/8 (SUTURE) ×2 IMPLANT
SUT SILK 2 0 (SUTURE) ×2
SUT SILK 2-0 18XBRD TIE 12 (SUTURE) ×1 IMPLANT
SUT VIC AB 2-0 CT1 27 (SUTURE) ×2
SUT VIC AB 2-0 CT1 TAPERPNT 27 (SUTURE) ×1 IMPLANT
SYR CONTROL 10ML LL (SYRINGE) ×1 IMPLANT
TOWEL OR 17X24 6PK STRL BLUE (TOWEL DISPOSABLE) ×2 IMPLANT
TOWEL OR 17X26 10 PK STRL BLUE (TOWEL DISPOSABLE) ×2 IMPLANT
TOWEL OR NON WOVEN STRL DISP B (DISPOSABLE) ×1 IMPLANT
WATER STERILE IRR 1000ML POUR (IV SOLUTION) IMPLANT

## 2011-10-09 NOTE — Preoperative (Signed)
Beta Blockers   Reason not to administer Beta Blockers:Not Applicable 

## 2011-10-09 NOTE — Op Note (Signed)
Hannah Alvarez 1966/01/23 [id number] 08/28/2011  Preoperative diagnosis: Infraumbilical ventral hernia,Chronically incarcerated  Postoperative diagnosis: Same  Procedure: Repair of chronically incarcerated ventral hernia with partial omentectomy  Surgeon: Currie Paris, MD, FACS  Anesthesia: General   Clinical History and Indications: This patient has a ventral hernia presenting to a few inches below the umbilicus. It was only partially reducible and I thought contained omentum. She elected to have this repaired.    Description of Procedure: I saw the patient in the preoperative period and confirmed the plans. The hernia was marked on the skin. The patient was taken to the operating room and after satisfactory general anesthesia had been obtained the abdomen was prepped and draped. The timeout was done.  I made a vertical incision directly over the palpable hernia. I divided the subcutaneous tissue and identified the hernia sac. This was cleaned off of the surrounding subcutaneous tissue until I could identify fascia the hernia sac was opened and it contained omentum. I attempted to reduce it completely but was unable to do so. I. Therefore used Kelly clamps to divide the omentum and resected the chronic incarcerated portion. The hernia defect measured approximately 2 cm. I elevated the subcutaneous tissue off the fascia for approximately 3 cm in all directions. I then closed the defect with 5 sutures of interrupted 0 Prolene. The middle and 2 bands were applied and left on cut. A piece of 3 x 6" Bard mesh was cut to fit and coverage the repair as well as the exposed fascia so that I had a coverage of about 2.5 cm in all directions around the repair area and the sutures used to primarily close the repair were threaded through the mesh and tied down. This anchored the mesh to the repair. It was then sutured to the fascia circumferentially with additional sutures of 0 Prolene placed at the  edges of the mesh. There. Closed easily and without tension.  I irrigated and made sure everything was dry. 30 cc of 0.25% plain Marcaine was placed to help reduce postoperative pain. The incision was closed with 2-0 Vicryl in layers followed by 4-0 Monocryl subcuticular plus Dermabond. The patient tolerated the procedure well. There were no operative complications. All counts were correct.  Currie Paris, MD, FACS 10/09/2011 8:33 AM

## 2011-10-09 NOTE — Anesthesia Postprocedure Evaluation (Signed)
  Anesthesia Post-op Note  Patient: Hannah Alvarez  Procedure(s) Performed: Procedure(s) (LRB): HERNIA REPAIR VENTRAL ADULT (N/A)  Patient Location: PACU  Anesthesia Type: General  Level of Consciousness: awake, oriented and patient cooperative  Airway and Oxygen Therapy: Patient Spontanous Breathing and Patient connected to nasal cannula oxygen  Post-op Pain: mild  Post-op Assessment: Post-op Vital signs reviewed, Patient's Cardiovascular Status Stable, Respiratory Function Stable, Patent Airway, No signs of Nausea or vomiting and Pain level controlled  Post-op Vital Signs: stable  Complications: No apparent anesthesia complications

## 2011-10-09 NOTE — Telephone Encounter (Signed)
Pt calling to report her "stomach is getting really big" since getting home from surgery today.  She denies pain, nausea or vomiting.  After conferring with Dr. Jamey Ripa, she can expect some gas, but should resolve over next 24 hours or so.  If worsens, becomes painful, or new nausea/ vomitting, go to ER.  She understands.

## 2011-10-09 NOTE — Anesthesia Preprocedure Evaluation (Addendum)
Anesthesia Evaluation  Patient identified by MRN, date of birth, ID band Patient awake    Reviewed: Allergy & Precautions, H&P , NPO status , Patient's Chart, lab work & pertinent test results  History of Anesthesia Complications (+) PONV  Airway Mallampati: I TM Distance: >3 FB Neck ROM: full    Dental  (+) Teeth Intact   Pulmonary    Pulmonary exam normal       Cardiovascular hypertension, Pt. on medications Rhythm:regular Rate:Normal     Neuro/Psych  Headaches,    GI/Hepatic GERD-  Medicated and Controlled,  Endo/Other    Renal/GU      Musculoskeletal   Abdominal   Peds  Hematology   Anesthesia Other Findings   Reproductive/Obstetrics                         Anesthesia Physical Anesthesia Plan  ASA: II  Anesthesia Plan: General   Post-op Pain Management:    Induction: Intravenous  Airway Management Planned: Oral ETT  Additional Equipment:   Intra-op Plan:   Post-operative Plan: Extubation in OR  Informed Consent: I have reviewed the patients History and Physical, chart, labs and discussed the procedure including the risks, benefits and alternatives for the proposed anesthesia with the patient or authorized representative who has indicated his/her understanding and acceptance.   Dental advisory given  Plan Discussed with: CRNA, Anesthesiologist and Surgeon  Anesthesia Plan Comments:        Anesthesia Quick Evaluation

## 2011-10-09 NOTE — Transfer of Care (Signed)
Immediate Anesthesia Transfer of Care Note  Patient: Hannah Alvarez  Procedure(s) Performed: Procedure(s) (LRB): HERNIA REPAIR VENTRAL ADULT (N/A)  Patient Location: PACU  Anesthesia Type: General  Level of Consciousness: awake, alert , oriented and patient cooperative  Airway & Oxygen Therapy: Patient Spontanous Breathing and Patient connected to nasal cannula oxygen  Post-op Assessment: Report given to PACU RN and Post -op Vital signs reviewed and stable  Post vital signs: Reviewed and stable  Complications: No apparent anesthesia complications

## 2011-10-09 NOTE — H&P (Signed)
CC Hernia  HPI: Patient presents today for repair of a ventral hernia that is a few inches below the umbilicus. It has been intermittently mildly symptomatic,  PMH:  Past Medical History  Diagnosis Date  . Prolactin increased   . Female circumcision   . Hypertension   . PONV (postoperative nausea and vomiting)   . Gestational diabetes 2005  . GERD (gastroesophageal reflux disease)   . Headache     migraines last one 09/28/11  . Constipation   . Umbilical hernia     PSH:  Past Surgical History  Procedure Date  . Tonsillectomy and adenoidectomy 1987  . Cesarean section 2002/2005    X 2   Soc Hx:  History   Social History  . Marital Status: Married    Spouse Name: N/A    Number of Children: N/A  . Years of Education: N/A   Occupational History  . Not on file.   Social History Main Topics  . Smoking status: Never Smoker   . Smokeless tobacco: Never Used  . Alcohol Use: No  . Drug Use: No  . Sexually Active: Yes    Birth Control/ Protection: None   Other Topics Concern  . Not on file   Social History Narrative  . No narrative on file   Allegy:  Allergies  Allergen Reactions  . Aspirin Other (See Comments)    GI upset   Meds:  No current facility-administered medications on file prior to encounter.   Current Outpatient Prescriptions on File Prior to Encounter  Medication Sig Dispense Refill  . bromocriptine (PARLODEL) 2.5 MG tablet Take 2.5 mg by mouth daily.       . Calcium Carb-Cholecalciferol (CALCIUM 1000 + D PO) Take 2,000 Units by mouth daily at 8 pm.       . Cetirizine HCl (ZYRTEC PO) Take 1 tablet by mouth daily.       Marland Kitchen dicyclomine (BENTYL) 10 MG capsule Take 10 mg by mouth daily.       . hydrochlorothiazide (HYDRODIURIL) 25 MG tablet Take 25 mg by mouth daily.        Patient ID: Hannah Alvarez, female DOB: [birth year], 46 y.o. MRN: [id number]  Chief Complaint   Patient presents with   .  Hernia   HPI  Hannah Alvarez is a 46 y.o. female.  She has had a hernia below her umbilicus for many years. She thinks it first developed after her first C-section. She had another C-section a few years later and seemed to get bigger after that. She was seen in our office in 2008 for this and a CT scan showed a small infraumbilical incision. She is asymptomatic at that time and no surgery was done.  In the interval she thinks it has become somewhat bigger. She has pain from time to time and she is also having some abdominal symptoms from time to time. She is asymptomatic today when she came in the office. She's not have nausea vomiting fevers chills diarrhea or constipation.  HPI  Past Medical History   Diagnosis  Date   .  Prolactin increased    .  Female circumcision    .  Hypertension     Past Surgical History   Procedure  Date   .  Tonsillectomy and adenoidectomy  1987   .  Cesarean section  2002/2005     X 2    Family History   Problem  Relation  Age of Onset   .  Hypertension  Mother    Social History  History   Substance Use Topics   .  Smoking status:  Never Smoker   .  Smokeless tobacco:  Never Used   .  Alcohol Use:  No    Allergies   Allergen  Reactions   .  Aspirin     Current Outpatient Prescriptions   Medication  Sig  Dispense  Refill   .  Calcium Carb-Cholecalciferol (CALCIUM 1000 + D PO)  Take 1,000 Units by mouth daily at 8 pm.     .  bromocriptine (PARLODEL) 2.5 MG tablet  Take 2.5 mg by mouth daily.     .  cabergoline (DOSTINEX) 0.5 MG tablet  Take 1 tablet (0.5 mg total) by mouth 2 (two) times a week.  10 tablet  12   .  Cetirizine HCl (ZYRTEC PO)  Take by mouth.     .  dicyclomine (BENTYL) 10 MG capsule  Take 10 mg by mouth daily.     Marland Kitchen  loratadine (CLARITIN) 10 MG tablet  Take 10 mg by mouth daily.     Marland Kitchen  DISCONTD: bromocriptine (PARLODEL) 2.5 MG tablet  Take 1 tablet (2.5 mg total) by mouth daily.  30 tablet  6   Review of Systems  Review of Systems  Constitutional: Negative for fever, chills and  unexpected weight change.  HENT: Negative for hearing loss, congestion, sore throat, trouble swallowing and voice change.  Eyes: Negative for visual disturbance.  Respiratory: Negative for cough and wheezing.  Cardiovascular: Negative for chest pain, palpitations and leg swelling.  Gastrointestinal: Positive for abdominal distention. Negative for nausea, vomiting, abdominal pain, diarrhea, constipation, blood in stool and anal bleeding.  Genitourinary: Negative for hematuria, vaginal bleeding and difficulty urinating.  Musculoskeletal: Negative for arthralgias.  Skin: Negative for rash and wound.  Neurological: Negative for seizures, syncope and headaches.  Hematological: Negative for adenopathy. Does not bruise/bleed easily.  Psychiatric/Behavioral: Negative for confusion.  Blood pressure 130/88, pulse 80, resp. rate 16, height 5\' 7"  (1.702 m), weight 159 lb (72.122 kg).  Physical Exam  Physical Exam  Vitals reviewed.  Constitutional: She is oriented to person, place, and time. She appears well-developed and well-nourished. No distress.  HENT:  Head: Normocephalic and atraumatic.  Mouth/Throat: Oropharynx is clear and moist.  Eyes: Conjunctivae and EOM are normal. Pupils are equal, round, and reactive to light. No scleral icterus.  Neck: Normal range of motion. Neck supple. No tracheal deviation present. No thyromegaly present.  Cardiovascular: Normal rate, regular rhythm, normal heart sounds and intact distal pulses. Exam reveals no gallop and no friction rub.  No murmur heard.  Pulmonary/Chest: Effort normal and breath sounds normal. No respiratory distress. She has no wheezes. She has no rales.  Abdominal: Soft. Bowel sounds are normal. She exhibits no distension and no mass. There is no tenderness. There is no rebound and no guarding.  Partially reducible hernia about two or three inches below the umbilicus. It is not tender and she notes that is unchanged today from its usual.    Musculoskeletal: Normal range of motion. She exhibits no edema and no tenderness.  Neurological: She is alert and oriented to person, place, and time.  Skin: Skin is warm and dry. No rash noted. She is not diaphoretic. No erythema.  Psychiatric: She has a normal mood and affect. Her behavior is normal. Judgment and thought content normal.    Imp: Ventral hernia  Plan; Repair - Have reviewed plans this am with  patient and answered questions. Marked the site of the hernia.

## 2011-10-10 ENCOUNTER — Telehealth (INDEPENDENT_AMBULATORY_CARE_PROVIDER_SITE_OTHER): Payer: Self-pay

## 2011-10-10 NOTE — Telephone Encounter (Signed)
Pt called stating she is still having some bloating and gas. Pt states she is passing flatus. No nausea. No vomiting. No fever. Voiding well. Pt states last BM was 6-10. Pt advised that narcotic pain meds encourage constipation. Pt advised to drink plenty of fluids and take miralax to help with constipation. Pt also advised to use Gas-x if needed. Pt to limit solid foods until constipation and bloating resolves. Pt advised if she stops passing gas,bloating becomes worse,vomiting occurs or abd pain occurs to call our office asap for direction. Pt will try to limit narcotics to see if this will help her constipation. Pt to call with concerns.

## 2011-10-11 ENCOUNTER — Encounter (HOSPITAL_COMMUNITY): Payer: Self-pay | Admitting: Surgery

## 2011-10-12 ENCOUNTER — Encounter (INDEPENDENT_AMBULATORY_CARE_PROVIDER_SITE_OTHER): Payer: PRIVATE HEALTH INSURANCE | Admitting: Surgery

## 2011-10-12 ENCOUNTER — Telehealth (INDEPENDENT_AMBULATORY_CARE_PROVIDER_SITE_OTHER): Payer: Self-pay | Admitting: General Surgery

## 2011-10-12 NOTE — Telephone Encounter (Signed)
Patient returned your call.

## 2011-10-12 NOTE — Telephone Encounter (Signed)
Left message to check on patient to see how she is doing. Had appt today that she no showed. Was having issues post surgery. Awaiting call back.

## 2011-10-12 NOTE — Telephone Encounter (Signed)
Patient doing well. Gas is getting better. States she did not know about appt today. Appt made for her on 11/02/2011. She will call with any problems prior.

## 2011-11-02 ENCOUNTER — Encounter (INDEPENDENT_AMBULATORY_CARE_PROVIDER_SITE_OTHER): Payer: PRIVATE HEALTH INSURANCE | Admitting: Surgery

## 2011-11-05 ENCOUNTER — Telehealth (INDEPENDENT_AMBULATORY_CARE_PROVIDER_SITE_OTHER): Payer: Self-pay | Admitting: General Surgery

## 2011-11-05 NOTE — Telephone Encounter (Signed)
Pt calling to state she missed her appt (showed up late) last week and was reschedule for 11/23/11 for recheck appt (ventral hernia repair.)  She states she is having intermitant pain on the Rt side, not like a knife, but more than dull, aching pain.  She cannot describe it.  Denies it is gas pain.  When asked if her pain medicine works for this pain she agrees that it does.  Suggested she use her pain meds until her follow-up appt.

## 2011-11-07 ENCOUNTER — Telehealth (INDEPENDENT_AMBULATORY_CARE_PROVIDER_SITE_OTHER): Payer: Self-pay

## 2011-11-07 NOTE — Telephone Encounter (Signed)
Patient called to schedule a sooner appointment than 07/26.  She missed her 1st po appt.

## 2011-11-07 NOTE — Telephone Encounter (Signed)
Patient has not shown up to her last two appts. I do not have anything sooner. If someone cancels I will call her.

## 2011-11-23 ENCOUNTER — Ambulatory Visit (INDEPENDENT_AMBULATORY_CARE_PROVIDER_SITE_OTHER): Payer: PRIVATE HEALTH INSURANCE | Admitting: Surgery

## 2011-11-23 ENCOUNTER — Encounter (INDEPENDENT_AMBULATORY_CARE_PROVIDER_SITE_OTHER): Payer: Self-pay | Admitting: Surgery

## 2011-11-23 VITALS — BP 114/68 | HR 66 | Temp 97.3°F | Resp 14 | Ht 67.0 in | Wt 158.4 lb

## 2011-11-23 DIAGNOSIS — Z09 Encounter for follow-up examination after completed treatment for conditions other than malignant neoplasm: Secondary | ICD-10-CM

## 2011-11-23 DIAGNOSIS — K436 Other and unspecified ventral hernia with obstruction, without gangrene: Secondary | ICD-10-CM

## 2011-11-23 NOTE — Progress Notes (Signed)
NAME: Hannah Alvarez                                            DOB: 05-11-1965 DATE: 11/23/2011                                                  MRN: [id number]  CC: Post op   HPI: This patient comes in for post op follow-up.Sheunderwent Repair of a chronically incarcerated ventral hernia below the umbilicus  on 10/09/11. She feels that she is doing well.However, she notes that she has some right lower quadrant discomfort when she stands up. Other than that no apparent problems.  PE:  VITAL SIGNS: BP 114/68  Pulse 66  Temp 97.3 F (36.3 C) (Temporal)  Resp 14  Ht 5\' 7"  (1.702 m)  Wt 158 lb 6 oz (71.838 kg)  BMI 24.80 kg/m2  General: The patient appears to be healthy, NAD The abdomen is soft and benign. The incision is nicely healed. The repair appears solid.  DATA REVIEWED: Past report she showed benign fibrous tissue consistent with hernia sac and omentum  IMPRESSION: The patient is doing well S/P Hernia repair. I'm not certain of the etiology of the right lower quadrant discomfort but may well be from some scarring around the mesh.Marland Kitchen    PLAN: She can resume all normal activities. We will see her back when necessary.

## 2011-11-23 NOTE — Patient Instructions (Addendum)
You may resume all normal activities We will see you again on an as needed basis. Please call the office at [phone] if you have any questions or concerns. Thank you for allowing us to take care of you.  

## 2012-01-08 ENCOUNTER — Encounter (INDEPENDENT_AMBULATORY_CARE_PROVIDER_SITE_OTHER): Payer: Self-pay | Admitting: Surgery

## 2012-01-08 ENCOUNTER — Ambulatory Visit (INDEPENDENT_AMBULATORY_CARE_PROVIDER_SITE_OTHER): Payer: BC Managed Care – PPO | Admitting: Surgery

## 2012-01-08 VITALS — BP 120/82 | HR 68 | Temp 99.3°F | Resp 16 | Ht 67.0 in | Wt 164.2 lb

## 2012-01-08 DIAGNOSIS — R1031 Right lower quadrant pain: Secondary | ICD-10-CM

## 2012-01-08 NOTE — Patient Instructions (Signed)
We'll obtain a CT scan of the abdomen to see if we can find a cause for your right-sided abdominal pain.

## 2012-01-08 NOTE — Progress Notes (Signed)
Chief complaint: Right lower quadrant pain post ventral hernia repair  History of present illness: This patient underwent repair of infraumbilical chronically incarcerated hernia on 10/09/2011. The defect itself measured about 2-1/2 cm in diameter and was fixed with primary suture repair an onlay mesh. She has had intermittent right-sided pain since then. I saw her in July of May have been just postsurgical but she's continued to have the pain. It really hurts when she moves in certain directions or lifts something up. Sometimes she'll wake up at night finding herself lying on her right side and it will hurt. When She is not doing any significant  activities is no pain. She's had no GI symptoms whatsoever.  Exam: Vital signs:BP 120/82  Pulse 68  Temp 99.3 F (37.4 C)  Resp 16  Ht 5\' 7"  (1.702 m)  Wt 164 lb 3.2 oz (74.481 kg)  BMI 25.72 kg/m2 Gen.: Patient alert oriented healthy-appearing, NAD. Abdomen: Soft and basically benign. There is a little tenderness on the right abdomen just lateral to her midline incision. There is no mass. The repair appeared solid and intact. Incision is healed nicely. There is no evidence of infection or other problems. Bowel sounds are normal. No masses organomegaly. No other hernias are noted.  Impression: No evidence of recurrent hernia; right-sided pain of uncertain etiology but seems musculoskeletal based on the fact that his present usually just with motion or activity.  Plan: We will get a CT scan just to make sure we don't have a recurrent hernia or some other problem in that area that I did not detect on physical exam.

## 2012-01-10 ENCOUNTER — Ambulatory Visit
Admission: RE | Admit: 2012-01-10 | Discharge: 2012-01-10 | Disposition: A | Discharge status: Home / Self Care | Payer: BC Managed Care – PPO | Source: Ambulatory Visit | Attending: Surgery | Admitting: Surgery

## 2012-01-10 DIAGNOSIS — R1031 Right lower quadrant pain: Secondary | ICD-10-CM

## 2012-01-10 MED ORDER — IOHEXOL 300 MG/ML  SOLN
100.0000 mL | Freq: Once | INTRAMUSCULAR | Status: AC | PRN
  Administered 2012-01-10: 100 mL via INTRAVENOUS

## 2012-01-11 ENCOUNTER — Telehealth (INDEPENDENT_AMBULATORY_CARE_PROVIDER_SITE_OTHER): Payer: Self-pay | Admitting: General Surgery

## 2012-01-11 NOTE — Progress Notes (Signed)
Quick Note:  There is no problem seen on CT to account for her pain - no recurrent hernia, no infection and no intra-abdominal problem to account for the pain. It may be related to some scaring from the surgery and needs more time to resolve ______

## 2012-01-11 NOTE — Telephone Encounter (Signed)
Patient made aware she has no restrictions from our standpoint.

## 2012-01-11 NOTE — Telephone Encounter (Signed)
Patient aware of results.

## 2012-01-11 NOTE — Telephone Encounter (Signed)
Message copied by Liliana Cline on Fri Jan 11, 2012  1:51 PM ------      Message from: Marin Shutter      Created: Fri Jan 11, 2012  1:41 PM      Regarding: Dr. Tomasita Crumble: [phone]       Still has pain at sx incision (6/13).  Went for an MRI and they said all was good.  She wants to know if she can go back to her normal activities, even though she still has pain after 3 months.

## 2012-01-11 NOTE — Telephone Encounter (Signed)
Message copied by Liliana Cline on Fri Jan 11, 2012  1:34 PM ------      Message from: Currie Paris      Created: Fri Jan 11, 2012 12:40 PM       There is no problem seen on CT to account for her pain - no recurrent hernia, no infection and no intra-abdominal problem to account for the pain. It may be related to some scaring from the surgery and needs more time to resolve

## 2012-02-22 ENCOUNTER — Telehealth: Payer: Self-pay | Admitting: Gastroenterology

## 2012-02-22 NOTE — Telephone Encounter (Signed)
Advised patient that we are unable to give any medications as we have not seen her since 2009. She has scheduled an appointment with Dr Jarold Motto.

## 2012-02-28 ENCOUNTER — Encounter: Payer: BC Managed Care – PPO | Admitting: Women's Health

## 2012-02-29 ENCOUNTER — Ambulatory Visit (INDEPENDENT_AMBULATORY_CARE_PROVIDER_SITE_OTHER): Payer: BC Managed Care – PPO | Admitting: Women's Health

## 2012-02-29 ENCOUNTER — Encounter: Payer: Self-pay | Admitting: Women's Health

## 2012-02-29 VITALS — BP 132/86 | Ht 66.0 in | Wt 168.0 lb

## 2012-02-29 DIAGNOSIS — Z01419 Encounter for gynecological examination (general) (routine) without abnormal findings: Secondary | ICD-10-CM

## 2012-02-29 DIAGNOSIS — R7989 Other specified abnormal findings of blood chemistry: Secondary | ICD-10-CM

## 2012-02-29 DIAGNOSIS — E349 Endocrine disorder, unspecified: Secondary | ICD-10-CM

## 2012-02-29 LAB — PROLACTIN: Prolactin: 45.9 ng/mL

## 2012-02-29 MED ORDER — CABERGOLINE 0.5 MG PO TABS: 0.2500 mg | ORAL_TABLET | ORAL | Status: DC

## 2012-02-29 NOTE — Progress Notes (Signed)
Hannah Alvarez Sep 08, 1965 [id number]    History:    The patient presents for annual exam.  Regular monthly cycle using no contraception, pregnancy okay. History of galactorrhea with elevated prolactin, currently on Parlodel 2.5 daily due to insurance but would like to change back to Dostinex. MRI 2012 showed 5 x 6 x 5 mm enhancing soft-tissue density in medial cavernous sinus on the right. Unusual appearance for microadenoma. Saw Dr. 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 Had MRIs 2001, 2004 that were negative.Marland Kitchen History of normal Paps, last mammogram 2010 normal. Hypertension, primary care labs and meds. Umbilical hernia repair 2013.   Past medical history, past surgical history, family history and social history were all reviewed and documented in the EPIC chart. 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 2 sons ages 65 and 8 doing well. History of GDM.   ROS:  A  ROS was performed and pertinent positives and negatives are included in the history.  Exam:  Filed Vitals:   02/29/12 1112  BP: 132/86    General appearance:  Normal Head/Neck:  Normal, without cervical or supraclavicular adenopathy. Thyroid:  Symmetrical, normal in size, without palpable masses or nodularity. Respiratory  Effort:  Normal  Auscultation:  Clear without wheezing or rhonchi Cardiovascular  Auscultation:  Regular rate, without rubs, murmurs or gallops  Edema/varicosities:  Not grossly evident Abdominal  Soft,nontender, without masses, guarding or rebound.  Liver/spleen:  No organomegaly noted  Hernia:  None appreciated  Skin  Inspection:  Grossly normal  Palpation:  Grossly normal Neurologic/psychiatric  Orientation:  Normal with appropriate conversation.  Mood/affect:  Normal  Genitourinary    Breasts: Examined lying and sitting/no discharge with exam.     Right: Without masses, retractions, discharge or axillary adenopathy.     Left: Without  masses, retractions, discharge or axillary adenopathy.   Inguinal/mons:  Normal without inguinal adenopathy  External genitalia:  Female circumcision   BUS/Urethra/Skene's glands:  Normal  Bladder:  Normal  Vagina:  Normal  Cervix:  Normal  Uterus:   normal in size, shape and contour.  Midline and mobile  Adnexa/parametria:     Rt: Without masses or tenderness.   Lt: Without masses or tenderness.  Anus and perineum: Normal  Digital rectal exam: Normal sphincter tone without palpated masses or tenderness  Assessment/Plan:  46 y.o. MPF G2 P2 for annual exam without complaint.    Galactorrhea with elevated prolactin on Parlodel 2.5 daily Hypertension-primary care meds and labs  Plan: Prolactin level, request to change back to Dostinex 0.5 half tablet twice weekly. (States feels better on Dostinex than Parlodel.) MRI 9/ 2012 was to have a repeat in 6 months for followup, patient thought she had a repeat scan, after calling [facility] radiology last brain MRI was done September 2012. We'll get scheduled. SBE's, overdue for mammogram and instructed to schedule. Reviewed importance of increasing regular exercise, decreasing calories for weight loss and health. Normal Pap 2012 reviewed new screening guidelines. Prolactin, will triage based on results. Blood pressure elevated at 132/86, patient reports normal blood pressures at primary care on HCTZ, states was upset over an aunt dying prior to office visit.     Harrington Challenger [facility], 12:28 PM 02/29/2012

## 2012-02-29 NOTE — Patient Instructions (Signed)

## 2012-03-13 ENCOUNTER — Encounter: Payer: Self-pay | Admitting: Gastroenterology

## 2012-03-13 ENCOUNTER — Ambulatory Visit (INDEPENDENT_AMBULATORY_CARE_PROVIDER_SITE_OTHER): Payer: BC Managed Care – PPO | Admitting: Gastroenterology

## 2012-03-13 VITALS — BP 100/70 | HR 76 | Ht 66.0 in | Wt 167.0 lb

## 2012-03-13 DIAGNOSIS — K589 Irritable bowel syndrome without diarrhea: Secondary | ICD-10-CM

## 2012-03-13 DIAGNOSIS — R1314 Dysphagia, pharyngoesophageal phase: Secondary | ICD-10-CM

## 2012-03-13 DIAGNOSIS — Z9889 Other specified postprocedural states: Secondary | ICD-10-CM

## 2012-03-13 DIAGNOSIS — K219 Gastro-esophageal reflux disease without esophagitis: Secondary | ICD-10-CM

## 2012-03-13 DIAGNOSIS — R1310 Dysphagia, unspecified: Secondary | ICD-10-CM

## 2012-03-13 DIAGNOSIS — Z8719 Personal history of other diseases of the digestive system: Secondary | ICD-10-CM

## 2012-03-13 DIAGNOSIS — R109 Unspecified abdominal pain: Secondary | ICD-10-CM

## 2012-03-13 DIAGNOSIS — R1319 Other dysphagia: Secondary | ICD-10-CM

## 2012-03-13 MED ORDER — ESOMEPRAZOLE MAGNESIUM 40 MG PO CPDR: 40.0000 mg | DELAYED_RELEASE_CAPSULE | Freq: Every day | ORAL | Status: DC

## 2012-03-13 MED ORDER — ALIGN PO CAPS: 1.0000 | ORAL_CAPSULE | Freq: Every day | ORAL | Status: DC

## 2012-03-13 MED ORDER — MOVIPREP 100 G PO SOLR: 1.0000 | Freq: Once | ORAL | Status: DC

## 2012-03-13 NOTE — Progress Notes (Signed)
History of Present Illness:  This is a 46 year old [place] female who we saw previously in 2005 for acid reflux.  At that time she underwent endoscopy and esophageal dilatation.  She also has a long history of IBS, and had upper abdominal ultrasound exam which showed gallbladder polyps but otherwise was unremarkable.  Since that time the patient has had repair of a ventral lower abdominal wall hernia by Dr. Jamey Ripa.  CT scan before her surgery in June apparently showed an incarcerated hernia.  She continues with gas, bloating, postprandial abdominal cramping mostly in the left upper quadrant and right lower quadrant.  She denies bowel or regularity, melena, hematochezia, nausea vomiting, or any specific hepatobiliary complaints.  She is good success with when necessary Bentyl 10 milligrams, but this is caused severe pruritus requiring attack use.  She denies any specific food intolerance, and previous evaluation for celiac disease was negative.  There is some question as to whether she was treated for H. pylori in the past.  Irving Burton history is noncontributory.  I have reviewed this patient's present history, medical and surgical past history, allergies and medications.     ROS: The remainder of the 10 point ROS is negative     Physical Exam: Blood pressure 100/70, pulse 76 and regular, and weight under and 67 pounds with a BMI of 26.95. General well developed well nourished patient in no acute distress, appearing their stated age Eyes PERRLA, no icterus, fundoscopic exam per opthamologist Skin no lesions noted Neck supple, no adenopathy, no thyroid enlargement, no tenderness Chest clear to percussion and auscultation Heart no significant murmurs, gallops or rubs noted Abdomen no hepatosplenomegaly masses or tenderness, BS normal.  Well-healed linear lower abdominal scar in the midline.  Bowel sounds are normal. Extremities no acute joint lesions, edema, phlebitis or evidence of  cellulitis. Neurologic patient oriented x 3, cranial nerves intact, no focal neurologic deficits noted. Psychological mental status normal and normal affect.  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  I have reviewed her reflux regime with her and have started Nexium 40 mg a day, scheduled followup endoscopy and possible dilatation, also think she needs colonoscopy because of persistent recurrent nature of her lower GI complaints in her age.  This also has been scheduled with propofol sedation.  I am reluctant to prescribe any anti-cholinergics with her pruritus side effects.  I have placed her on probiotic therapy pending above exams.  Small bowel biopsy will be done at the time of endoscopy to exclude celiac disease.  Encounter Diagnoses  Name Primary?  . IBS (irritable bowel syndrome) Yes  . GERD (gastroesophageal reflux disease)   . Abdominal pain

## 2012-03-13 NOTE — Patient Instructions (Signed)
Stop Bentyl per Dr. Jarold Motto  We have sent the following medications to your pharmacy for you to pick up at your convenience: Nexium. Please take one capsule by mouth thirty minutes before breakfast once daily.  Please purchase Align over the counter. This puts good bacteria back into your colon. You should take 1 capsule by mouth once daily.   You have been scheduled for an endoscopy and colonoscopy with propofol. Please follow the written instructions given to you at your visit today. Please pick up your prep at the pharmacy within the next 1-3 days. If you use inhalers (even only as needed) or a CPAP machine, please bring them with you on the day of your procedure.  ____________________________________________________________________________________________________________________  Diet for Gastroesophageal Reflux Disease, Adult Reflux (acid reflux) is when acid from your stomach flows up into the esophagus. When acid comes in contact with the esophagus, the acid causes irritation and soreness (inflammation) in the esophagus. When reflux happens often or so severely that it causes damage to the esophagus, it is called gastroesophageal reflux disease (GERD). Nutrition therapy can help ease the discomfort of GERD. FOODS OR DRINKS TO AVOID OR LIMIT  Smoking or chewing tobacco. Nicotine is one of the most potent stimulants to acid production in the gastrointestinal tract.  Caffeinated and decaffeinated coffee and black tea.  Regular or low-calorie carbonated beverages or energy drinks (caffeine-free carbonated beverages are allowed).   Strong spices, such as black pepper, white pepper, red pepper, cayenne, curry powder, and chili powder.  Peppermint or spearmint.  Chocolate.  High-fat foods, including meats and fried foods. Extra added fats including oils, butter, salad dressings, and nuts. Limit these to less than 8 tsp per day.  Fruits and vegetables if they are not tolerated, such  as citrus fruits or tomatoes.  Alcohol.  Any food that seems to aggravate your condition. If you have questions regarding your diet, call your caregiver or a registered dietitian. OTHER THINGS THAT MAY HELP GERD INCLUDE:   Eating your meals slowly, in a relaxed setting.  Eating 5 to 6 small meals per day instead of 3 large meals.  Eliminating food for a period of time if it causes distress.  Not lying down until 3 hours after eating a meal.  Keeping the head of your bed raised 6 to 9 inches (15 to 23 cm) by using a foam wedge or blocks under the legs of the bed. Lying flat may make symptoms worse.  Being physically active. Weight loss may be helpful in reducing reflux in overweight or obese adults.  Wear loose fitting clothing EXAMPLE MEAL PLAN This meal plan is approximately 2,000 calories based on https://www.bernard.org/ meal planning guidelines. Breakfast   cup cooked oatmeal.  1 cup strawberries.  1 cup low-fat milk.  1 oz almonds. Snack  1 cup cucumber slices.  6 oz yogurt (made from low-fat or fat-free milk). Lunch  2 slice whole-wheat bread.  2 oz sliced [place].  2 tsp mayonnaise.  1 cup blueberries.  1 cup snap peas. Snack  6 whole-wheat crackers.  1 oz string cheese. Dinner   cup brown rice.  1 cup mixed veggies.  1 tsp olive oil.  3 oz grilled fish. Document Released: 04/16/2005 Document Revised: 07/09/2011 Document Reviewed: 03/02/2011 Exit[employer] Patient Information 2013 [employer], [place].  __________________________________________________________________________________________________________________  Irritable Bowel Syndrome Irritable Bowel Syndrome (IBS) is caused by a disturbance of normal bowel function. Other terms used are spastic colon, mucous colitis, and irritable colon. It does not require surgery,  nor does it lead to cancer. There is no cure for IBS. But with proper diet, stress reduction, and medication, you will find that your  problems (symptoms) will gradually disappear or improve. IBS is a common digestive disorder. It usually appears in late adolescence or early adulthood. Women develop it twice as often as men. CAUSES  After food has been digested and absorbed in the small intestine, waste material is moved into the colon (large intestine). In the colon, water and salts are absorbed from the undigested products coming from the small intestine. The remaining residue, or fecal material, is held for elimination. Under normal circumstances, gentle, rhythmic contractions on the bowel walls push the fecal material along the colon towards the rectum. In IBS, however, these contractions are irregular and poorly coordinated. The fecal material is either retained too long, resulting in constipation, or expelled too soon, producing diarrhea. SYMPTOMS  The most common symptom of IBS is pain. It is typically in the lower left side of the belly (abdomen). But it may occur anywhere in the abdomen. It can be felt as heartburn, backache, or even as a dull pain in the arms or shoulders. The pain comes from excessive bowel-muscle spasms and from the buildup of gas and fecal material in the colon. This pain:  Can range from sharp belly (abdominal) cramps to a dull, continuous ache.  Usually worsens soon after eating.  Is typically relieved by having a bowel movement or passing gas. Abdominal pain is usually accompanied by constipation. But it may also produce diarrhea. The diarrhea typically occurs right after a meal or upon arising in the morning. The stools are typically soft and watery. They are often flecked with secretions (mucus). Other symptoms of IBS include:  Bloating.  Loss of appetite.  Heartburn.  Feeling sick to your stomach (nausea).  Belching  Vomiting  Gas. IBS may also cause a number of symptoms that are unrelated to the digestive system:  Fatigue.  Headaches.  Anxiety  Shortness of breath  Difficulty  in concentrating.  Dizziness. These symptoms tend to come and go. DIAGNOSIS  The symptoms of IBS closely mimic the symptoms of other, more serious digestive disorders. So your caregiver may wish to perform a variety of additional tests to exclude these disorders. He/she wants to be certain of learning what is wrong (diagnosis). The nature and purpose of each test will be explained to you. TREATMENT A number of medications are available to help correct bowel function and/or relieve bowel spasms and abdominal pain. Among the drugs available are:  Mild, non-irritating laxatives for severe constipation and to help restore normal bowel habits.  Specific anti-diarrheal medications to treat severe or prolonged diarrhea.  Anti-spasmodic agents to relieve intestinal cramps.  Your caregiver may also decide to treat you with a mild tranquilizer or sedative during unusually stressful periods in your life. The important thing to remember is that if any drug is prescribed for you, make sure that you take it exactly as directed. Make sure that your caregiver knows how well it worked for you. HOME CARE INSTRUCTIONS   Avoid foods that are high in fat or oils. Some examples NFA:OZHYQ cream, butter, frankfurters, sausage, and other fatty meats.  Avoid foods that have a laxative effect, such as fruit, fruit juice, and dairy products.  Cut out carbonated drinks, chewing gum, and "gassy" foods, such as beans and cabbage. This may help relieve bloating and belching.  Bran taken with plenty of liquids may help relieve constipation.  Keep track of what foods seem to trigger your symptoms.  Avoid emotionally charged situations or circumstances that produce anxiety.  Start or continue exercising.  Get plenty of rest and sleep. MAKE SURE YOU:   Understand these instructions.  Will watch your condition.  Will get help right away if you are not doing well or get worse. Document Released: 04/16/2005  Document Revised: 07/09/2011 Document Reviewed: 12/05/2007 [facility] Patient Information 2013 [place], [place].

## 2012-04-02 ENCOUNTER — Encounter: Payer: BC Managed Care – PPO | Admitting: Gastroenterology

## 2012-05-21 ENCOUNTER — Other Ambulatory Visit: Payer: Self-pay

## 2012-05-21 ENCOUNTER — Emergency Department (HOSPITAL_COMMUNITY)
Admission: EM | Admit: 2012-05-21 | Discharge: 2012-05-21 | Disposition: A | Discharge status: Home / Self Care | Payer: BC Managed Care – PPO | Attending: Emergency Medicine | Admitting: Emergency Medicine

## 2012-05-21 ENCOUNTER — Encounter (HOSPITAL_COMMUNITY): Payer: Self-pay

## 2012-05-21 ENCOUNTER — Emergency Department (HOSPITAL_COMMUNITY): Payer: BC Managed Care – PPO

## 2012-05-21 DIAGNOSIS — Y9389 Activity, other specified: Secondary | ICD-10-CM | POA: Insufficient documentation

## 2012-05-21 DIAGNOSIS — S4980XA Other specified injuries of shoulder and upper arm, unspecified arm, initial encounter: Secondary | ICD-10-CM | POA: Insufficient documentation

## 2012-05-21 DIAGNOSIS — S0993XA Unspecified injury of face, initial encounter: Secondary | ICD-10-CM | POA: Insufficient documentation

## 2012-05-21 DIAGNOSIS — S298XXA Other specified injuries of thorax, initial encounter: Secondary | ICD-10-CM | POA: Insufficient documentation

## 2012-05-21 DIAGNOSIS — Y92410 Unspecified street and highway as the place of occurrence of the external cause: Secondary | ICD-10-CM | POA: Insufficient documentation

## 2012-05-21 DIAGNOSIS — IMO0002 Reserved for concepts with insufficient information to code with codable children: Secondary | ICD-10-CM | POA: Insufficient documentation

## 2012-05-21 DIAGNOSIS — Z8619 Personal history of other infectious and parasitic diseases: Secondary | ICD-10-CM | POA: Insufficient documentation

## 2012-05-21 DIAGNOSIS — Z8632 Personal history of gestational diabetes: Secondary | ICD-10-CM | POA: Insufficient documentation

## 2012-05-21 DIAGNOSIS — Z79899 Other long term (current) drug therapy: Secondary | ICD-10-CM | POA: Insufficient documentation

## 2012-05-21 DIAGNOSIS — I10 Essential (primary) hypertension: Secondary | ICD-10-CM | POA: Insufficient documentation

## 2012-05-21 DIAGNOSIS — Z8719 Personal history of other diseases of the digestive system: Secondary | ICD-10-CM | POA: Insufficient documentation

## 2012-05-21 DIAGNOSIS — Z3202 Encounter for pregnancy test, result negative: Secondary | ICD-10-CM | POA: Insufficient documentation

## 2012-05-21 DIAGNOSIS — S46909A Unspecified injury of unspecified muscle, fascia and tendon at shoulder and upper arm level, unspecified arm, initial encounter: Secondary | ICD-10-CM | POA: Insufficient documentation

## 2012-05-21 DIAGNOSIS — K219 Gastro-esophageal reflux disease without esophagitis: Secondary | ICD-10-CM | POA: Insufficient documentation

## 2012-05-21 LAB — POCT PREGNANCY, URINE: Preg Test, Ur: NEGATIVE

## 2012-05-21 MED ORDER — IBUPROFEN 400 MG PO TABS
400.0000 mg | ORAL_TABLET | Freq: Once | ORAL | Status: AC
  Administered 2012-05-21: 400 mg via ORAL
  Filled 2012-05-21: qty 1

## 2012-05-21 MED ORDER — HYDROCODONE-ACETAMINOPHEN 5-325 MG PO TABS: 1.0000 | ORAL_TABLET | Freq: Four times a day (QID) | ORAL | Status: DC | PRN

## 2012-05-21 MED ORDER — HYDROCODONE-ACETAMINOPHEN 5-325 MG PO TABS
1.0000 | ORAL_TABLET | Freq: Once | ORAL | Status: AC
  Administered 2012-05-21: 1 via ORAL
  Filled 2012-05-21: qty 1
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
## 2012-05-21 NOTE — ED Notes (Signed)
Patient transported to X-ray 

## 2012-05-21 NOTE — ED Provider Notes (Signed)
Medical screening examination/treatment/procedure(s) were performed by non-physician practitioner and as supervising physician I was immediately available for consultation/collaboration.  Juliet Rude. Rubin Payor, MD 05/21/12 [phone]
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
## 2012-05-21 NOTE — ED Notes (Signed)
Pt returned from X-ray.  

## 2012-10-16 ENCOUNTER — Ambulatory Visit: Payer: Self-pay | Admitting: Gynecology

## 2012-10-23 ENCOUNTER — Encounter: Payer: Self-pay | Admitting: Women's Health

## 2012-10-23 ENCOUNTER — Ambulatory Visit (INDEPENDENT_AMBULATORY_CARE_PROVIDER_SITE_OTHER): Payer: BC Managed Care – PPO | Admitting: Women's Health

## 2012-10-23 DIAGNOSIS — N644 Mastodynia: Secondary | ICD-10-CM

## 2012-10-23 NOTE — Progress Notes (Signed)
Patient ID: Shacola A Gasaway, female   DOB: 1966-03-29, 47 y.o.   MRN: [id number] Presents with  bilateral breast tenderness. Starts week before her cycle, increases with cycle, no tenderness after cycle. Monthly cycle currently on half tablet Dostinex twice weekly. Prolactin 45.9 02/2012. Reports occasional white nipple discharge. Denies any bloody discharge. MRI no microadenoma. Has seen a neurologist in the past. Last mammogram 2010.Reports muscular aches and pains in upper and lower extremities for the past few months and questions if related to hyperprolactinemia.  Hypertension and hyper cholesterolemia treated per primary care.  Exam: Appears well, breast exam and sitting and lying position without palpable nodules, retractions, or nipple discharge. Minimal tenderness with exam.  Cyclic hormonal breast tenderness Elevated prolactin without microadenoma Possible muscle/joint  pain related to Zocor  Plan: Reviewed importance of annual screening mammograms. Instructed to schedule ASAP, history of dense breast tissue, 3-D tomography encouraged. Prolactin pending, continue Dostinex half tablet twice weekly. Instructed to followup with primary care for muscle/joint pain/aches possibly related from new addition of Zocor.

## 2012-10-24 LAB — PROLACTIN: Prolactin: 12.5 ng/mL

## 2013-03-04 ENCOUNTER — Other Ambulatory Visit: Payer: Self-pay | Admitting: Women's Health

## 2013-03-04 NOTE — Telephone Encounter (Signed)
Okay for medication, have her schedule annual exam.

## 2013-03-04 NOTE — Telephone Encounter (Signed)
Hannah Alvarez will call her and schedule CE.

## 2013-03-16 ENCOUNTER — Other Ambulatory Visit: Payer: Self-pay

## 2013-03-16 ENCOUNTER — Ambulatory Visit
Admission: RE | Admit: 2013-03-16 | Discharge: 2013-03-16 | Disposition: A | Discharge status: Home / Self Care | Payer: BC Managed Care – PPO | Source: Ambulatory Visit

## 2013-03-16 DIAGNOSIS — Z1231 Encounter for screening mammogram for malignant neoplasm of breast: Secondary | ICD-10-CM

## 2013-03-17 ENCOUNTER — Other Ambulatory Visit: Payer: Self-pay | Admitting: Women's Health

## 2013-03-17 DIAGNOSIS — R928 Other abnormal and inconclusive findings on diagnostic imaging of breast: Secondary | ICD-10-CM

## 2013-03-18 ENCOUNTER — Ambulatory Visit (INDEPENDENT_AMBULATORY_CARE_PROVIDER_SITE_OTHER): Payer: BC Managed Care – PPO | Admitting: Women's Health

## 2013-03-18 ENCOUNTER — Encounter: Payer: Self-pay | Admitting: Women's Health

## 2013-03-18 ENCOUNTER — Other Ambulatory Visit (HOSPITAL_COMMUNITY)
Admission: RE | Admit: 2013-03-18 | Discharge: 2013-03-18 | Disposition: A | Discharge status: Home / Self Care | Payer: BC Managed Care – PPO | Source: Ambulatory Visit | Attending: Gynecology | Admitting: Gynecology

## 2013-03-18 VITALS — BP 124/82 | Ht 67.0 in | Wt 167.4 lb

## 2013-03-18 DIAGNOSIS — Z833 Family history of diabetes mellitus: Secondary | ICD-10-CM

## 2013-03-18 DIAGNOSIS — R7989 Other specified abnormal findings of blood chemistry: Secondary | ICD-10-CM | POA: Insufficient documentation

## 2013-03-18 DIAGNOSIS — Z01419 Encounter for gynecological examination (general) (routine) without abnormal findings: Secondary | ICD-10-CM | POA: Insufficient documentation

## 2013-03-18 DIAGNOSIS — E349 Endocrine disorder, unspecified: Secondary | ICD-10-CM

## 2013-03-18 MED ORDER — CABERGOLINE 0.5 MG PO TABS: ORAL_TABLET | ORAL | Status: DC

## 2013-03-18 NOTE — Progress Notes (Signed)
Hannah Alvarez [birth year] [id number]    History:    The patient presents for annual exam.  Regular monthly cycle, no contraception reports had difficulty conceiving and has infrequent intercourse. Normal Pap and mammogram history. History of elevated prolactin with occasional breast discharge with negative MRIs 2001 and 2004. History of gestational diabetes. Hypertension primary care manages manages. Female circumcision, from [place].   Past medical history, past surgical history, family history and social history were all reviewed and documented in the EPIC chart. In school at [employer] for social work. Son's 11 and 9 both doing well. Mother hypertension.   ROS:  A  ROS was performed and pertinent positives and negatives are included in the history.  Exam:  Filed Vitals:   03/18/13 1116  BP: 124/82    General appearance:  Normal Head/Neck:  Normal, without cervical or supraclavicular adenopathy. Thyroid:  Symmetrical, normal in size, without palpable masses or nodularity. Respiratory  Effort:  Normal  Auscultation:  Clear without wheezing or rhonchi Cardiovascular  Auscultation:  Regular rate, without rubs, murmurs or gallops  Edema/varicosities:  Not grossly evident Abdominal  Soft,nontender, without masses, guarding or rebound.  Liver/spleen:  No organomegaly noted  Hernia:  None appreciated  Skin  Inspection:  Grossly normal  Palpation:  Grossly normal Neurologic/psychiatric  Orientation:  Normal with appropriate conversation.  Mood/affect:  Normal  Genitourinary    Breasts: Examined lying and sitting.     Right: Without masses, retractions, discharge or axillary adenopathy.     Left: Without masses, retractions, discharge or axillary adenopathy.   Inguinal/mons:  Normal without inguinal adenopathy  External genitalia: Female circumcision  BUS/Urethra/Skene's glands:  Normal  Bladder:  Normal  Vagina:  Normal  Cervix:  Normal  Uterus:   normal in size, shape and  contour.  Midline and mobile  Adnexa/parametria:     Rt: Without masses or tenderness.   Lt: Without masses or tenderness.  Anus and perineum: Normal  Digital rectal exam: Normal sphincter tone without palpated masses or tenderness  Assessment/Plan:  47 y.o. MBF G2P2 for annual exam with no complaints.    Elevated prolactin on Dostinex with negative MRIs x2 History of GDM Hypertension-primary care manages labs and meds  Plan: Prolactin, glucose, Pap. Pap normal 2012. New screening guidelines reviewed. SBE's, continue annual mammogram. Had a mammogram  this week needs additional films, reviewed will be contacted to schedule. 3DTomography reviewed and encouraged history of dense breasts. Encouraged regular exercise, calcium rich diet, vitamin D 1000 daily. Dostinex half tablet twice weekly prescription, proper use given and reviewed,  triage based on prolactin level. Contraception reviewed and declined.  Harrington Challenger [facility], 11:58 AM 03/18/2013

## 2013-03-18 NOTE — Patient Instructions (Signed)

## 2013-03-18 NOTE — Addendum Note (Signed)
Addended by: Richardson Chiquito on: 03/18/2013 03:17 PM   Modules accepted: Orders

## 2013-04-01 ENCOUNTER — Ambulatory Visit
Admission: RE | Admit: 2013-04-01 | Discharge: 2013-04-01 | Disposition: A | Discharge status: Home / Self Care | Payer: BC Managed Care – PPO | Source: Ambulatory Visit | Attending: Women's Health | Admitting: Women's Health

## 2013-04-01 DIAGNOSIS — R928 Other abnormal and inconclusive findings on diagnostic imaging of breast: Secondary | ICD-10-CM

## 2013-11-20 ENCOUNTER — Other Ambulatory Visit: Payer: Self-pay | Admitting: Gastroenterology

## 2014-03-01 ENCOUNTER — Encounter: Payer: Self-pay | Admitting: Women's Health

## 2014-05-07 ENCOUNTER — Encounter: Payer: BC Managed Care – PPO | Admitting: Women's Health

## 2014-05-21 ENCOUNTER — Encounter: Payer: Self-pay | Admitting: Women's Health

## 2014-05-31 ENCOUNTER — Ambulatory Visit (INDEPENDENT_AMBULATORY_CARE_PROVIDER_SITE_OTHER): Payer: 59 | Admitting: Women's Health

## 2014-05-31 ENCOUNTER — Encounter: Payer: Self-pay | Admitting: Women's Health

## 2014-05-31 VITALS — BP 124/78 | Ht 68.0 in | Wt 167.0 lb

## 2014-05-31 DIAGNOSIS — E229 Hyperfunction of pituitary gland, unspecified: Secondary | ICD-10-CM

## 2014-05-31 DIAGNOSIS — R7989 Other specified abnormal findings of blood chemistry: Secondary | ICD-10-CM

## 2014-05-31 DIAGNOSIS — E7800 Pure hypercholesterolemia, unspecified: Secondary | ICD-10-CM | POA: Insufficient documentation

## 2014-05-31 DIAGNOSIS — Z01419 Encounter for gynecological examination (general) (routine) without abnormal findings: Secondary | ICD-10-CM

## 2014-05-31 DIAGNOSIS — I10 Essential (primary) hypertension: Secondary | ICD-10-CM

## 2014-05-31 LAB — URINALYSIS W MICROSCOPIC + REFLEX CULTURE
Bilirubin Urine: NEGATIVE
CRYSTALS: NONE SEEN
Casts: NONE SEEN
GLUCOSE, UA: NEGATIVE mg/dL
Ketones, ur: NEGATIVE mg/dL
Leukocytes, UA: NEGATIVE
NITRITE: NEGATIVE
PROTEIN: NEGATIVE mg/dL
Specific Gravity, Urine: 1.018 (ref 1.005–1.030)
UROBILINOGEN UA: 0.2 mg/dL (ref 0.0–1.0)
pH: 5 (ref 5.0–8.0)

## 2014-05-31 MED ORDER — CABERGOLINE 0.5 MG PO TABS: ORAL_TABLET | ORAL | Status: DC

## 2014-05-31 NOTE — Patient Instructions (Signed)

## 2014-05-31 NOTE — Progress Notes (Signed)
Hannah Alvarez [birth year] [id number]    History:    Presents for annual exam.  Regular monthly cycle using no contraception . Hypertension and hypercholesterolemia managed by primary care. 10/2013 tubular adenoma on colonoscopy repeat in 5 years. Elevated prolactin 2 negative MRIs 2004, 2012. Normal Pap and mammogram history. 03/2013 mammogram normal after ultrasound. From [place], female circumcision. Gestational diabetes.  Past medical history, past surgical history, family history and social history were all reviewed and documented in the EPIC chart. Mother hypertension. 2 sons ages 62 and 75. Graduating from [employer] work in May.  ROS:  A ROS was performed and pertinent positives and negatives are included.  Exam:  Filed Vitals:   05/31/14 0920  BP: 124/78    General appearance:  Normal Thyroid:  Symmetrical, normal in size, without palpable masses or nodularity. Respiratory  Auscultation:  Clear without wheezing or rhonchi Cardiovascular  Auscultation:  Regular rate, without rubs, murmurs or gallops  Edema/varicosities:  Not grossly evident Abdominal  Soft,nontender, without masses, guarding or rebound.  Liver/spleen:  No organomegaly noted  Hernia:  None appreciated  Skin  Inspection:  Grossly normal   Breasts: Examined lying and sitting.     Right: Without masses, retractions, discharge or axillary adenopathy.     Left: Without masses, retractions, discharge or axillary adenopathy. Gentitourinary   Inguinal/mons:  Normal without inguinal adenopathy  External genitalia:  Female circumcision  BUS/Urethra/Skene's glands:  Normal  Vagina:  Normal  Cervix:  Normal  Uterus:  normal in size, shape and contour.  Midline and mobile  Adnexa/parametria:     Rt: Without masses or tenderness.   Lt: Without masses or tenderness.  Anus and perineum: Normal  Digital rectal exam: Normal sphincter tone without palpated masses or tenderness  Assessment/Plan:  49 y.o.  MBF G2P2 for  annual exam with no complaints.  Regular monthly cycle/no contraception Hypertension/hypercholesterolemia-primary care manages labs and meds Hyperprolactinemia negative MRIs, Dostinex in the past has not taken in the past year  Plan: Prolactin, will triage based on results. SBE's, schedule annual screening mammogram 3-D tomography reviewed and encouraged history of dense breasts. Increase regular exercise, calcium rich diet, vitamin D 1000 daily encouraged. Menopause reviewed- no symptoms. Pap normal 2014, new screening guidelines reviewed.  Huel Cote [facility], 9:57 AM 05/31/2014

## 2014-06-01 LAB — PROLACTIN: Prolactin: 44.3 ng/mL

## 2014-07-27 ENCOUNTER — Ambulatory Visit (INDEPENDENT_AMBULATORY_CARE_PROVIDER_SITE_OTHER): Payer: 59 | Admitting: Women's Health

## 2014-07-27 ENCOUNTER — Encounter: Payer: Self-pay | Admitting: Women's Health

## 2014-07-27 VITALS — BP 124/80 | Ht 68.0 in | Wt 166.0 lb

## 2014-07-27 DIAGNOSIS — N912 Amenorrhea, unspecified: Secondary | ICD-10-CM | POA: Diagnosis not present

## 2014-07-27 LAB — FOLLICLE STIMULATING HORMONE: FSH: 6.7 m[IU]/mL

## 2014-07-27 LAB — PREGNANCY, URINE: Preg Test, Ur: NEGATIVE

## 2014-07-27 NOTE — Progress Notes (Signed)
Patient ID: Hannah Alvarez, female   DOB: 20-Jun-1965, 49 y.o.   MRN: [id number] Presents with asymptomatic amenorrhea. Regular monthly cycle until January,l no contraception for many years. Denies menopausal symptoms, urinary symptoms, vaginal discharge, abdominal pain or fever. States has had slight breast tenderness.  Exam: Appears well. UPT negative. External genitalia female circumcision. Speculum exam no visible discharge, bimanual no CMT, uterus small.  Amenorrhea  Plan: FSH. We'll triage based on results. Menopause reviewed.

## 2014-07-27 NOTE — Patient Instructions (Signed)
Menopause Menopause is the normal time of life when menstrual periods stop completely. Menopause is complete when you have missed 12 consecutive menstrual periods. It usually occurs between the ages of 48 years and 55 years. Very rarely does a woman develop menopause before the age of 40 years. At menopause, your ovaries stop producing the female hormones estrogen and progesterone. This can cause undesirable symptoms and also affect your health. Sometimes the symptoms may occur 4-5 years before the menopause begins. There is no relationship between menopause and:  Oral contraceptives.  Number of children you had.  Race.  The age your menstrual periods started (menarche). Heavy smokers and very thin women may develop menopause earlier in life. CAUSES  The ovaries stop producing the female hormones estrogen and progesterone.  Other causes include:  Surgery to remove both ovaries.  The ovaries stop functioning for no known reason.  Tumors of the pituitary gland in the brain.  Medical disease that affects the ovaries and hormone production.  Radiation treatment to the abdomen or pelvis.  Chemotherapy that affects the ovaries. SYMPTOMS   Hot flashes.  Night sweats.  Decrease in sex drive.  Vaginal dryness and thinning of the vagina causing painful intercourse.  Dryness of the skin and developing wrinkles.  Headaches.  Tiredness.  Irritability.  Memory problems.  Weight gain.  Bladder infections.  Hair growth of the face and chest.  Infertility. More serious symptoms include:  Loss of bone (osteoporosis) causing breaks (fractures).  Depression.  Hardening and narrowing of the arteries (atherosclerosis) causing heart attacks and strokes. DIAGNOSIS   When the menstrual periods have stopped for 12 straight months.  Physical exam.  Hormone studies of the blood. TREATMENT  There are many treatment choices and nearly as many questions about them. The  decisions to treat or not to treat menopausal changes is an individual choice made with your health care provider. Your health care provider can discuss the treatments with you. Together, you can decide which treatment will work best for you. Your treatment choices may include:   Hormone therapy (estrogen and progesterone).  Non-hormonal medicines.  Treating the individual symptoms with medicine (for example antidepressants for depression).  Herbal medicines that may help specific symptoms.  Counseling by a psychiatrist or psychologist.  Group therapy.  Lifestyle changes including:  Eating healthy.  Regular exercise.  Limiting caffeine and alcohol.  Stress management and meditation.  No treatment. HOME CARE INSTRUCTIONS   Take the medicine your health care provider gives you as directed.  Get plenty of sleep and rest.  Exercise regularly.  Eat a diet that contains calcium (good for the bones) and soy products (acts like estrogen hormone).  Avoid alcoholic beverages.  Do not smoke.  If you have hot flashes, dress in layers.  Take supplements, calcium, and vitamin D to strengthen bones.  You can use over-the-counter lubricants or moisturizers for vaginal dryness.  Group therapy is sometimes very helpful.  Acupuncture may be helpful in some cases. SEEK MEDICAL CARE IF:   You are not sure you are in menopause.  You are having menopausal symptoms and need advice and treatment.  You are still having menstrual periods after age 55 years.  You have pain with intercourse.  Menopause is complete (no menstrual period for 12 months) and you develop vaginal bleeding.  You need a referral to a specialist (gynecologist, psychiatrist, or psychologist) for treatment. SEEK IMMEDIATE MEDICAL CARE IF:   You have severe depression.  You have excessive vaginal bleeding.    You fell and think you have a broken bone.  You have pain when you urinate.  You develop leg or  chest pain.  You have a fast pounding heart beat (palpitations).  You have severe headaches.  You develop vision problems.  You feel a lump in your breast.  You have abdominal pain or severe indigestion. Document Released: 07/07/2003 Document Revised: 12/17/2012 Document Reviewed: 11/13/2012 ExitCare Patient Information 2015 [employer]. This information is not intended to replace advice given to you by your health care provider. Make sure you discuss any questions you have with your health care provider.  

## 2014-07-28 ENCOUNTER — Other Ambulatory Visit: Payer: Self-pay | Admitting: Women's Health

## 2014-07-28 MED ORDER — MEDROXYPROGESTERONE ACETATE 10 MG PO TABS: 10.0000 mg | ORAL_TABLET | Freq: Every day | ORAL | Status: DC

## 2015-07-22 ENCOUNTER — Encounter: Payer: Self-pay | Admitting: Women's Health

## 2015-08-08 ENCOUNTER — Ambulatory Visit (INDEPENDENT_AMBULATORY_CARE_PROVIDER_SITE_OTHER): Payer: BLUE CROSS/BLUE SHIELD | Admitting: Women's Health

## 2015-08-08 ENCOUNTER — Encounter: Payer: Self-pay | Admitting: Women's Health

## 2015-08-08 VITALS — BP 126/80 | Ht 68.0 in | Wt 153.0 lb

## 2015-08-08 DIAGNOSIS — Z8632 Personal history of gestational diabetes: Secondary | ICD-10-CM | POA: Diagnosis not present

## 2015-08-08 DIAGNOSIS — E229 Hyperfunction of pituitary gland, unspecified: Secondary | ICD-10-CM

## 2015-08-08 DIAGNOSIS — Z1329 Encounter for screening for other suspected endocrine disorder: Secondary | ICD-10-CM

## 2015-08-08 DIAGNOSIS — Z01419 Encounter for gynecological examination (general) (routine) without abnormal findings: Secondary | ICD-10-CM | POA: Diagnosis not present

## 2015-08-08 DIAGNOSIS — R7989 Other specified abnormal findings of blood chemistry: Secondary | ICD-10-CM

## 2015-08-08 LAB — CBC WITH DIFFERENTIAL/PLATELET
Basophils Absolute: 40 cells/uL (ref 0–200)
Basophils Relative: 1 %
EOS ABS: 40 {cells}/uL (ref 15–500)
Eosinophils Relative: 1 %
HCT: 36.7 % (ref 35.0–45.0)
Hemoglobin: 12.2 g/dL (ref 11.7–15.5)
Lymphocytes Relative: 42 %
Lymphs Abs: 1680 cells/uL (ref 850–3900)
MCH: 27.9 pg (ref 27.0–33.0)
MCHC: 33.2 g/dL (ref 32.0–36.0)
MCV: 84 fL (ref 80.0–100.0)
MONOS PCT: 8 %
MPV: 9.5 fL (ref 7.5–12.5)
Monocytes Absolute: 320 cells/uL (ref 200–950)
Neutro Abs: 1920 cells/uL (ref 1500–7800)
Neutrophils Relative %: 48 %
PLATELETS: 353 10*3/uL (ref 140–400)
RBC: 4.37 MIL/uL (ref 3.80–5.10)
RDW: 14.4 % (ref 11.0–15.0)
WBC: 4 10*3/uL (ref 3.8–10.8)

## 2015-08-08 LAB — HEMOGLOBIN A1C
Hgb A1c MFr Bld: 5.8 % — ABNORMAL HIGH (ref <5.7)
Mean Plasma Glucose: 120 mg/dL

## 2015-08-08 LAB — TSH: TSH: 1.43 m[IU]/L

## 2015-08-08 MED ORDER — HYDROCORTISONE ACE-PRAMOXINE 2.5-1 % RE CREA: 1.0000 "application " | TOPICAL_CREAM | Freq: Three times a day (TID) | RECTAL | Status: DC

## 2015-08-08 NOTE — Patient Instructions (Addendum)
Health Maintenance, Female Adopting a healthy lifestyle and getting preventive care can go a long way to promote health and wellness. Talk with your health care provider about what schedule of regular examinations is right for you. This is a good chance for you to check in with your provider about disease prevention and staying healthy. In between checkups, there are plenty of things you can do on your own. Experts have done a lot of research about which lifestyle changes and preventive measures are most likely to keep you healthy. Ask your health care provider for more information. WEIGHT AND DIET  Eat a healthy diet  Be sure to include plenty of vegetables, fruits, low-fat dairy products, and lean protein.  Do not eat a lot of foods high in solid fats, added sugars, or salt.  Get regular exercise. This is one of the most important things you can do for your health.  Most adults should exercise for at least 150 minutes each week. The exercise should increase your heart rate and make you sweat (moderate-intensity exercise).  Most adults should also do strengthening exercises at least twice a week. This is in addition to the moderate-intensity exercise.  Maintain a healthy weight  Body mass index (BMI) is a measurement that can be used to identify possible weight problems. It estimates body fat based on height and weight. Your health care provider can help determine your BMI and help you achieve or maintain a healthy weight.  For females 20 years of age and older:   A BMI below 18.5 is considered underweight.  A BMI of 18.5 to 24.9 is normal.  A BMI of 25 to 29.9 is considered overweight.  A BMI of 30 and above is considered obese.  Watch levels of cholesterol and blood lipids  You should start having your blood tested for lipids and cholesterol at 52 years of age, then have this test every 5 years.  You may need to have your cholesterol levels checked more often if:  Your lipid  or cholesterol levels are high.  You are older than 52 years of age.  You are at high risk for heart disease.  CANCER SCREENING   Lung Cancer  Lung cancer screening is recommended for adults 55-80 years old who are at high risk for lung cancer because of a history of smoking.  A yearly low-dose CT scan of the lungs is recommended for people who:  Currently smoke.  Have quit within the past 15 years.  Have at least a 30-pack-year history of smoking. A pack year is smoking an average of one pack of cigarettes a day for 1 year.  Yearly screening should continue until it has been 15 years since you quit.  Yearly screening should stop if you develop a health problem that would prevent you from having lung cancer treatment.  Breast Cancer  Practice breast self-awareness. This means understanding how your breasts normally appear and feel.  It also means doing regular breast self-exams. Let your health care provider know about any changes, no matter how small.  If you are in your 20s or 30s, you should have a clinical breast exam (CBE) by a health care provider every 1-3 years as part of a regular health exam.  If you are 40 or older, have a CBE every year. Also consider having a breast X-ray (mammogram) every year.  If you have a family history of breast cancer, talk to your health care provider about genetic screening.  If you   are at high risk for breast cancer, talk to your health care provider about having an MRI and a mammogram every year.  Breast cancer gene (BRCA) assessment is recommended for women who have family members with BRCA-related cancers. BRCA-related cancers include:  Breast.  Ovarian.  Tubal.  Peritoneal cancers.  Results of the assessment will determine the need for genetic counseling and BRCA1 and BRCA2 testing. Cervical Cancer Your health care provider may recommend that you be screened regularly for cancer of the pelvic organs (ovaries, uterus, and  vagina). This screening involves a pelvic examination, including checking for microscopic changes to the surface of your cervix (Pap test). You may be encouraged to have this screening done every 3 years, beginning at age 21.  For women ages 30-65, health care providers may recommend pelvic exams and Pap testing every 3 years, or they may recommend the Pap and pelvic exam, combined with testing for human papilloma virus (HPV), every 5 years. Some types of HPV increase your risk of cervical cancer. Testing for HPV may also be done on women of any age with unclear Pap test results.  Other health care providers may not recommend any screening for nonpregnant women who are considered low risk for pelvic cancer and who do not have symptoms. Ask your health care provider if a screening pelvic exam is right for you.  If you have had past treatment for cervical cancer or a condition that could lead to cancer, you need Pap tests and screening for cancer for at least 20 years after your treatment. If Pap tests have been discontinued, your risk factors (such as having a new sexual partner) need to be reassessed to determine if screening should resume. Some women have medical problems that increase the chance of getting cervical cancer. In these cases, your health care provider may recommend more frequent screening and Pap tests. Colorectal Cancer  This type of cancer can be detected and often prevented.  Routine colorectal cancer screening usually begins at 52 years of age and continues through 52 years of age.  Your health care provider may recommend screening at an earlier age if you have risk factors for colon cancer.  Your health care provider may also recommend using home test kits to check for hidden blood in the stool.  A small camera at the end of a tube can be used to examine your colon directly (sigmoidoscopy or colonoscopy). This is done to check for the earliest forms of colorectal  cancer.  Routine screening usually begins at age 50.  Direct examination of the colon should be repeated every 5-10 years through 52 years of age. However, you may need to be screened more often if early forms of precancerous polyps or small growths are found. Skin Cancer  Check your skin from head to toe regularly.  Tell your health care provider about any new moles or changes in moles, especially if there is a change in a mole's shape or color.  Also tell your health care provider if you have a mole that is larger than the size of a pencil eraser.  Always use sunscreen. Apply sunscreen liberally and repeatedly throughout the day.  Protect yourself by wearing long sleeves, pants, a wide-brimmed hat, and sunglasses whenever you are outside. HEART DISEASE, DIABETES, AND HIGH BLOOD PRESSURE   High blood pressure causes heart disease and increases the risk of stroke. High blood pressure is more likely to develop in:  People who have blood pressure in the high end   of the normal range (130-139/85-89 mm Hg).  People who are overweight or obese.  People who are African American.  If you are 38-23 years of age, have your blood pressure checked every 3-5 years. If you are 61 years of age or older, have your blood pressure checked every year. You should have your blood pressure measured twice--once when you are at a hospital or clinic, and once when you are not at a hospital or clinic. Record the average of the two measurements. To check your blood pressure when you are not at a hospital or clinic, you can use:  An automated blood pressure machine at a pharmacy.  A home blood pressure monitor.  If you are between 45 years and 39 years old, ask your health care provider if you should take aspirin to prevent strokes.  Have regular diabetes screenings. This involves taking a blood sample to check your fasting blood sugar level.  If you are at a normal weight and have a low risk for diabetes,  have this test once every three years after 52 years of age.  If you are overweight and have a high risk for diabetes, consider being tested at a younger age or more often. PREVENTING INFECTION  Hepatitis B  If you have a higher risk for hepatitis B, you should be screened for this virus. You are considered at high risk for hepatitis B if:  You were born in a country where hepatitis B is common. Ask your health care provider which countries are considered high risk.  Your parents were born in a high-risk country, and you have not been immunized against hepatitis B (hepatitis B vaccine).  You have HIV or AIDS.  You use needles to inject street drugs.  You live with someone who has hepatitis B.  You have had sex with someone who has hepatitis B.  You get hemodialysis treatment.  You take certain medicines for conditions, including cancer, organ transplantation, and autoimmune conditions. Hepatitis C  Blood testing is recommended for:  Everyone born from [birth year] through 1967.  Anyone with known risk factors for hepatitis C. Sexually transmitted infections (STIs)  You should be screened for sexually transmitted infections (STIs) including gonorrhea and chlamydia if:  You are sexually active and are younger than 52 years of age.  You are older than 53 years of age and your health care provider tells you that you are at risk for this type of infection.  Your sexual activity has changed since you were last screened and you are at an increased risk for chlamydia or gonorrhea. Ask your health care provider if you are at risk.  If you do not have HIV, but are at risk, it may be recommended that you take a prescription medicine daily to prevent HIV infection. This is called pre-exposure prophylaxis (PrEP). You are considered at risk if:  You are sexually active and do not regularly use condoms or know the HIV status of your partner(s).  You take drugs by injection.  You are sexually  active with a partner who has HIV. Talk with your health care provider about whether you are at high risk of being infected with HIV. If you choose to begin PrEP, you should first be tested for HIV. You should then be tested every 3 months for as long as you are taking PrEP.  PREGNANCY   If you are premenopausal and you may become pregnant, ask your health care provider about preconception counseling.  If you may  become pregnant, take 400 to 800 micrograms (mcg) of folic acid every day.  If you want to prevent pregnancy, talk to your health care provider about birth control (contraception). OSTEOPOROSIS AND MENOPAUSE   Osteoporosis is a disease in which the bones lose minerals and strength with aging. This can result in serious bone fractures. Your risk for osteoporosis can be identified using a bone density scan.  If you are 60 years of age or older, or if you are at risk for osteoporosis and fractures, ask your health care provider if you should be screened.  Ask your health care provider whether you should take a calcium or vitamin D supplement to lower your risk for osteoporosis.  Menopause may have certain physical symptoms and risks.  Hormone replacement therapy may reduce some of these symptoms and risks. Talk to your health care provider about whether hormone replacement therapy is right for you.  HOME CARE INSTRUCTIONS   Schedule regular health, dental, and eye exams.  Stay current with your immunizations.   Do not use any tobacco products including cigarettes, chewing tobacco, or electronic cigarettes.  If you are pregnant, do not drink alcohol.  If you are breastfeeding, limit how much and how often you drink alcohol.  Limit alcohol intake to no more than 1 drink per day for nonpregnant women. One drink equals 12 ounces of beer, 5 ounces of wine, or 1 ounces of hard liquor.  Do not use street drugs.  Do not share needles.  Ask your health care provider for help if  you need support or information about quitting drugs.  Tell your health care provider if you often feel depressed.  Tell your health care provider if you have ever been abused or do not feel safe at home.   This information is not intended to replace advice given to you by your health care provider. Make sure you discuss any questions you have with your health care provider.   Document Released: 10/30/2010 Document Revised: 05/07/2014 Document Reviewed: 03/18/2013 Elsevier Interactive Patient Education 2016 [employer] Human Papillomavirus Quadrivalent Vaccine suspension for injection What is this medicine? HUMAN PAPILLOMAVIRUS VACCINE (HYOO muhn pap uh LOH muh vahy ruhs vak SEEN) is a vaccine. It is used to prevent infections of four types of the human papillomavirus. In women, the vaccine may lower your risk of getting cervical, vaginal, vulvar, or anal cancer and genital warts. In men, the vaccine may lower your risk of getting genital warts and anal cancer. You cannot get these diseases from the vaccine. This vaccine does not treat these diseases. This medicine may be used for other purposes; ask your health care provider or pharmacist if you have questions. What should I tell my health care provider before I take this medicine? They need to know if you have any of these conditions: -fever or infection -hemophilia -HIV infection or AIDS -immune system problems -low platelet count -an unusual reaction to Human Papillomavirus Vaccine, yeast, other medicines, foods, dyes, or preservatives -pregnant or trying to get pregnant -breast-feeding How should I use this medicine? This vaccine is for injection in a muscle on your upper arm or thigh. It is given by a health care professional. Dennis Bast will be observed for 15 minutes after each dose. Sometimes, fainting happens after the vaccine is given. You may be asked to sit or lie down during the 15 minutes. Three doses are given. The second dose is  given 2 months after the first dose. The last dose is given  4 months after the second dose. A copy of a Vaccine Information Statement will be given before each vaccination. Read this sheet carefully each time. The sheet may change frequently. Talk to your pediatrician regarding the use of this medicine in children. While this drug may be prescribed for children as young as 20 years of age for selected conditions, precautions do apply. Overdosage: If you think you have taken too much of this medicine contact a poison control center or emergency room at once. NOTE: This medicine is only for you. Do not share this medicine with others. What if I miss a dose? All 3 doses of the vaccine should be given within 6 months. Remember to keep appointments for follow-up doses. Your health care provider will tell you when to return for the next vaccine. Ask your health care professional for advice if you are unable to keep an appointment or miss a scheduled dose. What may interact with this medicine? -other vaccines This list may not describe all possible interactions. Give your health care provider a list of all the medicines, herbs, non-prescription drugs, or dietary supplements you use. Also tell them if you smoke, drink alcohol, or use illegal drugs. Some items may interact with your medicine. What should I watch for while using this medicine? This vaccine may not fully protect everyone. Continue to have regular pelvic exams and cervical or anal cancer screenings as directed by your doctor. The Human Papillomavirus is a sexually transmitted disease. It can be passed by any kind of sexual activity that involves genital contact. The vaccine works best when given before you have any contact with the virus. Many people who have the virus do not have any signs or symptoms. Tell your doctor or health care professional if you have any reaction or unusual symptom after getting the vaccine. What side effects may I notice  from receiving this medicine? Side effects that you should report to your doctor or health care professional as soon as possible: -allergic reactions like skin rash, itching or hives, swelling of the face, lips, or tongue -breathing problems -feeling faint or lightheaded, falls Side effects that usually do not require medical attention (report to your doctor or health care professional if they continue or are bothersome): -cough -dizziness -fever -headache -nausea -redness, warmth, swelling, pain, or itching at site where injected This list may not describe all possible side effects. Call your doctor for medical advice about side effects. You may report side effects to FDA at 1-800-FDA-1088. Where should I keep my medicine? This drug is given in a hospital or clinic and will not be stored at home. NOTE: This sheet is a summary. It may not cover all possible information. If you have questions about this medicine, talk to your doctor, pharmacist, or health care provider.    2016, Elsevier/[employer] Standard. (2013-06-08 13:14:33) DASH Eating Plan DASH stands for "Dietary Approaches to Stop Hypertension." The DASH eating plan is a healthy eating plan that has been shown to reduce high blood pressure (hypertension). Additional health benefits may include reducing the risk of type 2 diabetes mellitus, heart disease, and stroke. The DASH eating plan may also help with weight loss. WHAT DO I NEED TO KNOW ABOUT THE DASH EATING PLAN? For the DASH eating plan, you will follow these general guidelines:  Choose foods with a percent daily value for sodium of less than 5% (as listed on the food label).  Use salt-free seasonings or herbs instead of table salt or sea salt.  Check with your health care provider or pharmacist before using salt substitutes.  Eat lower-sodium products, often labeled as "lower sodium" or "no salt added."  Eat fresh foods.  Eat more vegetables, fruits, and low-fat dairy  products.  Choose whole grains. Look for the word "whole" as the first word in the ingredient list.  Choose fish and skinless chicken or [place] more often than red meat. Limit fish, poultry, and meat to 6 oz (170 g) each day.  Limit sweets, desserts, sugars, and sugary drinks.  Choose heart-healthy fats.  Limit cheese to 1 oz (28 g) per day.  Eat more home-cooked food and less restaurant, buffet, and fast food.  Limit fried foods.  Cook foods using methods other than frying.  Limit canned vegetables. If you do use them, rinse them well to decrease the sodium.  When eating at a restaurant, ask that your food be prepared with less salt, or no salt if possible. WHAT FOODS CAN I EAT? Seek help from a dietitian for individual calorie needs. Grains Whole grain or whole wheat bread. Brown rice. Whole grain or whole wheat pasta. Quinoa, bulgur, and whole grain cereals. Low-sodium cereals. Corn or whole wheat flour tortillas. Whole grain cornbread. Whole grain crackers. Low-sodium crackers. Vegetables Fresh or frozen vegetables (raw, steamed, roasted, or grilled). Low-sodium or reduced-sodium tomato and vegetable juices. Low-sodium or reduced-sodium tomato sauce and paste. Low-sodium or reduced-sodium canned vegetables.  Fruits All fresh, canned (in natural juice), or frozen fruits. Meat and Other Protein Products Ground beef (85% or leaner), grass-fed beef, or beef trimmed of fat. Skinless chicken or [place]. Ground chicken or [place]. Pork trimmed of fat. All fish and seafood. Eggs. Dried beans, peas, or lentils. Unsalted nuts and seeds. Unsalted canned beans. Dairy Low-fat dairy products, such as skim or 1% milk, 2% or reduced-fat cheeses, low-fat ricotta or cottage cheese, or plain low-fat yogurt. Low-sodium or reduced-sodium cheeses. Fats and Oils Tub margarines without trans fats. Light or reduced-fat mayonnaise and salad dressings (reduced sodium). Avocado. Safflower, olive, or canola  oils. Natural peanut or almond butter. Other Unsalted popcorn and pretzels. The items listed above may not be a complete list of recommended foods or beverages. Contact your dietitian for more options. WHAT FOODS ARE NOT RECOMMENDED? Grains White bread. White pasta. White rice. Refined cornbread. Bagels and croissants. Crackers that contain trans fat. Vegetables Creamed or fried vegetables. Vegetables in a cheese sauce. Regular canned vegetables. Regular canned tomato sauce and paste. Regular tomato and vegetable juices. Fruits Dried fruits. Canned fruit in light or heavy syrup. Fruit juice. Meat and Other Protein Products Fatty cuts of meat. Ribs, chicken wings, bacon, sausage, bologna, salami, chitterlings, fatback, hot dogs, bratwurst, and packaged luncheon meats. Salted nuts and seeds. Canned beans with salt. Dairy Whole or 2% milk, cream, half-and-half, and cream cheese. Whole-fat or sweetened yogurt. Full-fat cheeses or blue cheese. Nondairy creamers and whipped toppings. Processed cheese, cheese spreads, or cheese curds. Condiments Onion and garlic salt, seasoned salt, table salt, and sea salt. Canned and packaged gravies. Worcestershire sauce. Tartar sauce. Barbecue sauce. Teriyaki sauce. Soy sauce, including reduced sodium. Steak sauce. Fish sauce. Oyster sauce. Cocktail sauce. Horseradish. Ketchup and mustard. Meat flavorings and tenderizers. Bouillon cubes. Hot sauce. Tabasco sauce. Marinades. Taco seasonings. Relishes. Fats and Oils Butter, stick margarine, lard, shortening, ghee, and bacon fat. Coconut, palm kernel, or palm oils. Regular salad dressings. Other Pickles and olives. Salted popcorn and pretzels. The items listed above may not be a complete list of foods  and beverages to avoid. Contact your dietitian for more information. WHERE CAN I FIND MORE INFORMATION? National Heart, Lung, and Blood Institute: travelstabloid.com   This  information is not intended to replace advice given to you by your health care provider. Make sure you discuss any questions you have with your health care provider.   Document Released: 04/05/2011 Document Revised: 05/07/2014 Document Reviewed: 02/18/2013 Elsevier Interactive Patient Education [employer].

## 2015-08-08 NOTE — Progress Notes (Signed)
Hannah Alvarez 04-Oct-1965 [id number]    History:    Presents for annual exam.  Mostly monthly cycles using no contraception/rhythm. Skipped February cycle March cycle normal. History of elevated prolactin with 2 negative MRIs in 2004 and 2012, Dostinex in the past. Prolactins have been normal since. Normal Pap and mammogram history overdue for mammogram. History of GDM. 2015 tubular adenoma 5 year repeat. Had been treated by primary care for hypertension and hypercholesterolemia made dietary changes and is now off both medications. From [place]/female circumcision.  Past medical history, past surgical history, family history and social history were all reviewed and documented in the EPIC chart. Graduated from [employer] and is enrolling in graduate school for social work. Sons ages 49 and 25 both doing well. Mother hypertension.  ROS:  A ROS was performed and pertinent positives and negatives are included.  Exam:  Filed Vitals:   08/08/15 0937  BP: 126/80    General appearance:  Normal Thyroid:  Symmetrical, normal in size, without palpable masses or nodularity. Respiratory  Auscultation:  Clear without wheezing or rhonchi Cardiovascular  Auscultation:  Regular rate, without rubs, murmurs or gallops  Edema/varicosities:  Not grossly evident Abdominal  Soft,nontender, without masses, guarding or rebound.  Liver/spleen:  No organomegaly noted  Hernia:  None appreciated  Skin  Inspection:  Grossly normal   Breasts: Examined lying and sitting.     Right: Without masses, retractions, discharge or axillary adenopathy.     Left: Without masses, retractions, discharge or axillary adenopathy. Gentitourinary   Inguinal/mons:  Normal without inguinal adenopathy  External genitalia:  Female circumcision  BUS/Urethra/Skene's glands:  Normal  Vagina:  Normal  Cervix:  Normal  Uterus:   normal in size, shape and contour.  Midline and mobile  Adnexa/parametria:     Rt: Without masses or  tenderness.   Lt: Without masses or tenderness.  Anus and perineum: Normal  Digital rectal exam: Normal sphincter tone without palpated masses or tenderness  Assessment/Plan:  50 y.o. MBF G2 P2 for annual exam with no complaints.  Mostly monthly cycle/no contraception History of hypertension and hypercholesterolemia-made dietary and lifestyle changes, wt loss, on no medication now  Plan: SBE's, reviewed importance of annual screening mammogram, instructed to schedule breast center ASAP. Increase regular exercise, calcium rich diet, vitamin D 1000 daily encouraged. Contraception options reviewed and declines. Menopause reviewed, instructed to call if no cycle greater than 3 months. Congratulated on weight loss of 15 pounds. Reports normal lipid panel at primary care. CBC, TSH, hemoglobin A1c, prolactin, UA, Pap. New screening guidelines reviewed.   Huel Cote WHNP, 10:40 AM 08/08/2015

## 2015-08-09 ENCOUNTER — Other Ambulatory Visit: Payer: Self-pay | Admitting: Women's Health

## 2015-08-09 DIAGNOSIS — R7989 Other specified abnormal findings of blood chemistry: Secondary | ICD-10-CM

## 2015-08-09 DIAGNOSIS — E229 Hyperfunction of pituitary gland, unspecified: Principal | ICD-10-CM

## 2015-08-09 LAB — URINALYSIS W MICROSCOPIC + REFLEX CULTURE
Bilirubin Urine: NEGATIVE
Casts: NONE SEEN [LPF]
Crystals: NONE SEEN [HPF]
GLUCOSE, UA: NEGATIVE
HGB URINE DIPSTICK: NEGATIVE
Ketones, ur: NEGATIVE
LEUKOCYTES UA: NEGATIVE
Nitrite: NEGATIVE
Protein, ur: NEGATIVE
RBC / HPF: NONE SEEN RBC/HPF (ref <=2)
Specific Gravity, Urine: 1.018 (ref 1.001–1.035)
WBC, UA: NONE SEEN WBC/HPF (ref <=5)
YEAST: NONE SEEN [HPF]
pH: 6.5 (ref 5.0–8.0)

## 2015-08-09 LAB — PAP IG W/ RFLX HPV ASCU

## 2015-08-09 LAB — PROLACTIN: Prolactin: 39.6 ng/mL — ABNORMAL HIGH

## 2015-08-09 MED ORDER — CABERGOLINE 0.5 MG PO TABS: ORAL_TABLET | ORAL | Status: DC

## 2015-08-11 LAB — URINE CULTURE: Colony Count: 35000

## 2016-09-12 ENCOUNTER — Encounter: Payer: Self-pay | Admitting: Gynecology

## 2016-09-17 ENCOUNTER — Encounter: Payer: BLUE CROSS/BLUE SHIELD | Admitting: Women's Health

## 2016-09-19 ENCOUNTER — Ambulatory Visit (INDEPENDENT_AMBULATORY_CARE_PROVIDER_SITE_OTHER): Payer: BLUE CROSS/BLUE SHIELD | Admitting: Women's Health

## 2016-09-19 ENCOUNTER — Encounter: Payer: Self-pay | Admitting: Women's Health

## 2016-09-19 ENCOUNTER — Other Ambulatory Visit: Payer: Self-pay | Admitting: Women's Health

## 2016-09-19 VITALS — BP 124/80 | Ht 68.0 in | Wt 150.0 lb

## 2016-09-19 DIAGNOSIS — K640 First degree hemorrhoids: Secondary | ICD-10-CM

## 2016-09-19 DIAGNOSIS — R7989 Other specified abnormal findings of blood chemistry: Secondary | ICD-10-CM

## 2016-09-19 DIAGNOSIS — Z01419 Encounter for gynecological examination (general) (routine) without abnormal findings: Secondary | ICD-10-CM

## 2016-09-19 DIAGNOSIS — E229 Hyperfunction of pituitary gland, unspecified: Principal | ICD-10-CM

## 2016-09-19 LAB — PROLACTIN: Prolactin: 45.8 ng/mL — ABNORMAL HIGH

## 2016-09-19 MED ORDER — LIDOCAINE-HYDROCORTISONE ACE 2.8-0.55 % RE GEL: RECTAL | 1 refills | Status: DC

## 2016-09-19 MED ORDER — CABERGOLINE 0.5 MG PO TABS: ORAL_TABLET | ORAL | 4 refills | Status: DC

## 2016-09-19 NOTE — Progress Notes (Signed)
Hannah Alvarez October 09, 1965 [id number]    History:    Presents for annual exam. Continues to have monthly cycle rhythm for contraception. Rare menopausal symptoms. Normal Pap and mammogram history mammogram overdue. 2015 tubular adenoma on colonoscopy 5 year follow-up. History of increased prolactin levels with negative MRIs had been on Dostinex but has not been taking. Recovering from a GI virus. Primary care manages hypertension, GERD and monitors lipid panel.    Past medical history, past surgical history, family history and social history were all reviewed and documented in the EPIC chart. 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 Originally from [place] has a female circumcision is planning a three-month vacation there this summer. Sons are ages 81 and 39 both doing well.   ROS:  A ROS was performed and pertinent positives and negatives are included.  Exam:  Vitals:   09/19/16 0809  BP: 124/80  Weight: 150 lb (68 kg)  Height: 5\' 8"  (1.727 m)   Body mass index is 22.81 kg/m.   General appearance:  Normal Thyroid:  Symmetrical, normal in size, without palpable masses or nodularity. Respiratory  Auscultation:  Clear without wheezing or rhonchi Cardiovascular  Auscultation:  Regular rate, without rubs, murmurs or gallops  Edema/varicosities:  Not grossly evident Abdominal  Soft,nontender, without masses, guarding or rebound.  Liver/spleen:  No organomegaly noted  Hernia:  None appreciated  Skin  Inspection:  Grossly normal   Breasts: Examined lying and sitting.     Right: Without masses, retractions, discharge or axillary adenopathy.     Left: Without masses, retractions, discharge or axillary adenopathy. Gentitourinary   Inguinal/mons:  Normal without inguinal adenopathy  External genitalia: Female circumcision  BUS/Urethra/Skene's glands:  Normal  Vagina:  Normal  Cervix:  Normal  Uterus:  normal in size, shape and contour.  Midline and  mobile  Adnexa/parametria:     Rt: Without masses or tenderness.   Lt: Without masses or tenderness.  Anus and perineum: Normal  Digital rectal exam: Normal sphincter tone without palpated masses or tenderness, small hemorrhoid  Assessment/Plan:  51 y.o. MBF G5 P2 for annual exam, recovering from GI virus continues to have some GI upset.  Monthly cycle/rhythm Hypertension/GERD-primary care manages labs and meds History of elevated prolactin with negative MRIs-stopped Dostinex on own  Plan: SBE's, reviewed importance of annual screening mammogram instructed to schedule at breast center. Continue active lifestyle, exercise, calcium rich diet, vitamin D 1000 daily encouraged. Menopause reviewed, minimal symptoms. Prolactin, Pap normal 2017, new screening guidelines reviewed. Rx for rectal gel given.   [place], 9:46 AM 09/19/2016

## 2016-09-19 NOTE — Patient Instructions (Signed)
Health Maintenance, Female Adopting a healthy lifestyle and getting preventive care can go a long way to promote health and wellness. Talk with your health care provider about what schedule of regular examinations is right for you. This is a good chance for you to check in with your provider about disease prevention and staying healthy. In between checkups, there are plenty of things you can do on your own. Experts have done a lot of research about which lifestyle changes and preventive measures are most likely to keep you healthy. Ask your health care provider for more information. Weight and diet Eat a healthy diet  Be sure to include plenty of vegetables, fruits, low-fat dairy products, and lean protein.  Do not eat a lot of foods high in solid fats, added sugars, or salt.  Get regular exercise. This is one of the most important things you can do for your health.  Most adults should exercise for at least 150 minutes each week. The exercise should increase your heart rate and make you sweat (moderate-intensity exercise).  Most adults should also do strengthening exercises at least twice a week. This is in addition to the moderate-intensity exercise. Maintain a healthy weight  Body mass index (BMI) is a measurement that can be used to identify possible weight problems. It estimates body fat based on height and weight. Your health care provider can help determine your BMI and help you achieve or maintain a healthy weight.  For females 76 years of age and older:  A BMI below 18.5 is considered underweight.  A BMI of 18.5 to 24.9 is normal.  A BMI of 25 to 29.9 is considered overweight.  A BMI of 30 and above is considered obese. Watch levels of cholesterol and blood lipids  You should start having your blood tested for lipids and cholesterol at 53 years of age, then have this test every 5 years.  You may need to have your cholesterol levels checked more often if:  Your lipid or  cholesterol levels are high.  You are older than 53 years of age.  You are at high risk for heart disease. Cancer screening Lung Cancer  Lung cancer screening is recommended for adults 64-42 years old who are at high risk for lung cancer because of a history of smoking.  A yearly low-dose CT scan of the lungs is recommended for people who:  Currently smoke.  Have quit within the past 15 years.  Have at least a 30-pack-year history of smoking. A pack year is smoking an average of one pack of cigarettes a day for 1 year.  Yearly screening should continue until it has been 15 years since you quit.  Yearly screening should stop if you develop a health problem that would prevent you from having lung cancer treatment. Breast Cancer  Practice breast self-awareness. This means understanding how your breasts normally appear and feel.  It also means doing regular breast self-exams. Let your health care provider know about any changes, no matter how small.  If you are in your 20s or 30s, you should have a clinical breast exam (CBE) by a health care provider every 1-3 years as part of a regular health exam.  If you are 34 or older, have a CBE every year. Also consider having a breast X-ray (mammogram) every year.  If you have a family history of breast cancer, talk to your health care provider about genetic screening.  If you are at high risk for breast cancer, talk  to your health care provider about having an MRI and a mammogram every year.  Breast cancer gene (BRCA) assessment is recommended for women who have family members with BRCA-related cancers. BRCA-related cancers include:  Breast.  Ovarian.  Tubal.  Peritoneal cancers.  Results of the assessment will determine the need for genetic counseling and BRCA1 and BRCA2 testing. Cervical Cancer  Your health care provider may recommend that you be screened regularly for cancer of the pelvic organs (ovaries, uterus, and vagina).  This screening involves a pelvic examination, including checking for microscopic changes to the surface of your cervix (Pap test). You may be encouraged to have this screening done every 3 years, beginning at age 24.  For women ages 66-65, health care providers may recommend pelvic exams and Pap testing every 3 years, or they may recommend the Pap and pelvic exam, combined with testing for human papilloma virus (HPV), every 5 years. Some types of HPV increase your risk of cervical cancer. Testing for HPV may also be done on women of any age with unclear Pap test results.  Other health care providers may not recommend any screening for nonpregnant women who are considered low risk for pelvic cancer and who do not have symptoms. Ask your health care provider if a screening pelvic exam is right for you.  If you have had past treatment for cervical cancer or a condition that could lead to cancer, you need Pap tests and screening for cancer for at least 20 years after your treatment. If Pap tests have been discontinued, your risk factors (such as having a new sexual partner) need to be reassessed to determine if screening should resume. Some women have medical problems that increase the chance of getting cervical cancer. In these cases, your health care provider may recommend more frequent screening and Pap tests. Colorectal Cancer  This type of cancer can be detected and often prevented.  Routine colorectal cancer screening usually begins at 53 years of age and continues through 53 years of age.  Your health care provider may recommend screening at an earlier age if you have risk factors for colon cancer.  Your health care provider may also recommend using home test kits to check for hidden blood in the stool.  A small camera at the end of a tube can be used to examine your colon directly (sigmoidoscopy or colonoscopy). This is done to check for the earliest forms of colorectal cancer.  Routine  screening usually begins at age 41.  Direct examination of the colon should be repeated every 5-10 years through 53 years of age. However, you may need to be screened more often if early forms of precancerous polyps or small growths are found. Skin Cancer  Check your skin from head to toe regularly.  Tell your health care provider about any new moles or changes in moles, especially if there is a change in a mole's shape or color.  Also tell your health care provider if you have a mole that is larger than the size of a pencil eraser.  Always use sunscreen. Apply sunscreen liberally and repeatedly throughout the day.  Protect yourself by wearing long sleeves, pants, a wide-brimmed hat, and sunglasses whenever you are outside. Heart disease, diabetes, and high blood pressure  High blood pressure causes heart disease and increases the risk of stroke. High blood pressure is more likely to develop in:  People who have blood pressure in the high end of the normal range (130-139/85-89 mm Hg).  People who are overweight or obese.  People who are African American.  If you are 59-24 years of age, have your blood pressure checked every 3-5 years. If you are 34 years of age or older, have your blood pressure checked every year. You should have your blood pressure measured twice-once when you are at a hospital or clinic, and once when you are not at a hospital or clinic. Record the average of the two measurements. To check your blood pressure when you are not at a hospital or clinic, you can use:  An automated blood pressure machine at a pharmacy.  A home blood pressure monitor.  If you are between 29 years and 60 years old, ask your health care provider if you should take aspirin to prevent strokes.  Have regular diabetes screenings. This involves taking a blood sample to check your fasting blood sugar level.  If you are at a normal weight and have a low risk for diabetes, have this test once  every three years after 53 years of age.  If you are overweight and have a high risk for diabetes, consider being tested at a younger age or more often. Preventing infection Hepatitis B  If you have a higher risk for hepatitis B, you should be screened for this virus. You are considered at high risk for hepatitis B if:  You were born in a country where hepatitis B is common. Ask your health care provider which countries are considered high risk.  Your parents were born in a high-risk country, and you have not been immunized against hepatitis B (hepatitis B vaccine).  You have HIV or AIDS.  You use needles to inject street drugs.  You live with someone who has hepatitis B.  You have had sex with someone who has hepatitis B.  You get hemodialysis treatment.  You take certain medicines for conditions, including cancer, organ transplantation, and autoimmune conditions. Hepatitis C  Blood testing is recommended for:  Everyone born from [birth year] through 1967.  Anyone with known risk factors for hepatitis C. Sexually transmitted infections (STIs)  You should be screened for sexually transmitted infections (STIs) including gonorrhea and chlamydia if:  You are sexually active and are younger than 53 years of age.  You are older than 53 years of age and your health care provider tells you that you are at risk for this type of infection.  Your sexual activity has changed since you were last screened and you are at an increased risk for chlamydia or gonorrhea. Ask your health care provider if you are at risk.  If you do not have HIV, but are at risk, it may be recommended that you take a prescription medicine daily to prevent HIV infection. This is called pre-exposure prophylaxis (PrEP). You are considered at risk if:  You are sexually active and do not regularly use condoms or know the HIV status of your partner(s).  You take drugs by injection.  You are sexually active with a partner  who has HIV. Talk with your health care provider about whether you are at high risk of being infected with HIV. If you choose to begin PrEP, you should first be tested for HIV. You should then be tested every 3 months for as long as you are taking PrEP. Pregnancy  If you are premenopausal and you may become pregnant, ask your health care provider about preconception counseling.  If you may become pregnant, take 400 to 800 micrograms (mcg) of folic acid  every day.  If you want to prevent pregnancy, talk to your health care provider about birth control (contraception). Osteoporosis and menopause  Osteoporosis is a disease in which the bones lose minerals and strength with aging. This can result in serious bone fractures. Your risk for osteoporosis can be identified using a bone density scan.  If you are 4 years of age or older, or if you are at risk for osteoporosis and fractures, ask your health care provider if you should be screened.  Ask your health care provider whether you should take a calcium or vitamin D supplement to lower your risk for osteoporosis.  Menopause may have certain physical symptoms and risks.  Hormone replacement therapy may reduce some of these symptoms and risks. Talk to your health care provider about whether hormone replacement therapy is right for you. Follow these instructions at home:  Schedule regular health, dental, and eye exams.  Stay current with your immunizations.  Do not use any tobacco products including cigarettes, chewing tobacco, or electronic cigarettes.  If you are pregnant, do not drink alcohol.  If you are breastfeeding, limit how much and how often you drink alcohol.  Limit alcohol intake to no more than 1 drink per day for nonpregnant women. One drink equals 12 ounces of beer, 5 ounces of wine, or 1 ounces of hard liquor.  Do not use street drugs.  Do not share needles.  Ask your health care provider for help if you need support  or information about quitting drugs.  Tell your health care provider if you often feel depressed.  Tell your health care provider if you have ever been abused or do not feel safe at home. This information is not intended to replace advice given to you by your health care provider. Make sure you discuss any questions you have with your health care provider. Document Released: 10/30/2010 Document Revised: 09/22/2015 Document Reviewed: 01/18/2015 Elsevier Interactive Patient Education  2017 [employer].

## 2017-02-08 ENCOUNTER — Encounter (INDEPENDENT_AMBULATORY_CARE_PROVIDER_SITE_OTHER): Payer: Self-pay | Admitting: Orthopaedic Surgery

## 2017-02-08 ENCOUNTER — Ambulatory Visit (INDEPENDENT_AMBULATORY_CARE_PROVIDER_SITE_OTHER): Payer: BLUE CROSS/BLUE SHIELD | Admitting: Orthopaedic Surgery

## 2017-02-08 ENCOUNTER — Ambulatory Visit (INDEPENDENT_AMBULATORY_CARE_PROVIDER_SITE_OTHER): Payer: BLUE CROSS/BLUE SHIELD

## 2017-02-08 VITALS — BP 135/85 | HR 75 | Resp 14 | Ht 64.0 in | Wt 150.0 lb

## 2017-02-08 DIAGNOSIS — R0781 Pleurodynia: Secondary | ICD-10-CM
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
## 2017-08-20 ENCOUNTER — Encounter (INDEPENDENT_AMBULATORY_CARE_PROVIDER_SITE_OTHER): Payer: Self-pay

## 2018-05-30 ENCOUNTER — Other Ambulatory Visit: Payer: Self-pay | Admitting: Otolaryngology

## 2018-05-30 ENCOUNTER — Ambulatory Visit
Admission: RE | Admit: 2018-05-30 | Discharge: 2018-05-30 | Disposition: A | Discharge status: Home / Self Care | Payer: Self-pay | Source: Ambulatory Visit | Attending: Otolaryngology | Admitting: Otolaryngology

## 2018-05-30 DIAGNOSIS — M5412 Radiculopathy, cervical region: Secondary | ICD-10-CM

## 2018-06-02 DIAGNOSIS — I10 Essential (primary) hypertension: Secondary | ICD-10-CM

## 2018-06-02 LAB — GLUCOSE, POCT (MANUAL RESULT ENTRY): POC Glucose: 95 mg/dl (ref 70–99)

## 2018-06-02 NOTE — Congregational Nurse Program (Signed)
Ms Kivi  came in for blood pressure check. Not taking any medication current. I have provided health education regarding hypertension and heart disease, diet and exercise.She will make an appoint with her PCP to be seen soon.  Honor Loh RN BSN PCCN  [phone]

## 2018-06-11 ENCOUNTER — Other Ambulatory Visit: Payer: Self-pay | Admitting: Otolaryngology

## 2018-06-11 ENCOUNTER — Ambulatory Visit
Admission: RE | Admit: 2018-06-11 | Discharge: 2018-06-11 | Disposition: A | Discharge status: Home / Self Care | Payer: BLUE CROSS/BLUE SHIELD | Source: Ambulatory Visit | Attending: Otolaryngology | Admitting: Otolaryngology

## 2018-06-11 DIAGNOSIS — M542 Cervicalgia: Secondary | ICD-10-CM

## 2018-06-11 DIAGNOSIS — M25511 Pain in right shoulder: Secondary | ICD-10-CM

## 2018-11-02 ENCOUNTER — Emergency Department (HOSPITAL_COMMUNITY)
Admission: EM | Admit: 2018-11-02 | Discharge: 2018-11-02 | Disposition: A | Discharge status: Home / Self Care | Payer: BLUE CROSS/BLUE SHIELD | Attending: Emergency Medicine | Admitting: Emergency Medicine

## 2018-11-02 ENCOUNTER — Other Ambulatory Visit: Payer: Self-pay

## 2018-11-02 ENCOUNTER — Emergency Department (HOSPITAL_COMMUNITY): Payer: BLUE CROSS/BLUE SHIELD

## 2018-11-02 ENCOUNTER — Encounter (HOSPITAL_COMMUNITY): Payer: Self-pay | Admitting: *Deleted

## 2018-11-02 DIAGNOSIS — E860 Dehydration: Secondary | ICD-10-CM | POA: Diagnosis not present

## 2018-11-02 DIAGNOSIS — I10 Essential (primary) hypertension: Secondary | ICD-10-CM | POA: Diagnosis not present

## 2018-11-02 DIAGNOSIS — R110 Nausea: Secondary | ICD-10-CM | POA: Diagnosis not present

## 2018-11-02 DIAGNOSIS — R197 Diarrhea, unspecified: Secondary | ICD-10-CM | POA: Insufficient documentation

## 2018-11-02 DIAGNOSIS — U071 COVID-19: Secondary | ICD-10-CM | POA: Insufficient documentation

## 2018-11-02 DIAGNOSIS — R1110 Vomiting, unspecified: Secondary | ICD-10-CM | POA: Diagnosis present

## 2018-11-02 DIAGNOSIS — Z79899 Other long term (current) drug therapy: Secondary | ICD-10-CM | POA: Insufficient documentation

## 2018-11-02 HISTORY — DX: Hyperlipidemia, unspecified: E78.5

## 2018-11-02 LAB — URINALYSIS, ROUTINE W REFLEX MICROSCOPIC
Bilirubin Urine: NEGATIVE
Glucose, UA: NEGATIVE mg/dL
Hgb urine dipstick: NEGATIVE
Ketones, ur: NEGATIVE mg/dL
Leukocytes,Ua: NEGATIVE
Nitrite: NEGATIVE
Protein, ur: NEGATIVE mg/dL
Specific Gravity, Urine: 1.003 — ABNORMAL LOW (ref 1.005–1.030)
pH: 6 (ref 5.0–8.0)

## 2018-11-02 LAB — COMPREHENSIVE METABOLIC PANEL
ALT: 21 U/L (ref 0–44)
AST: 23 U/L (ref 15–41)
Albumin: 3.9 g/dL (ref 3.5–5.0)
Alkaline Phosphatase: 90 U/L (ref 38–126)
Anion gap: 9 (ref 5–15)
BUN: 9 mg/dL (ref 6–20)
CO2: 25 mmol/L (ref 22–32)
Calcium: 8.9 mg/dL (ref 8.9–10.3)
Chloride: 106 mmol/L (ref 98–111)
Creatinine, Ser: 0.59 mg/dL (ref 0.44–1.00)
GFR calc Af Amer: >60 mL/min (ref >60)
GFR calc non Af Amer: >60 mL/min (ref >60)
Glucose, Bld: 103 mg/dL — ABNORMAL HIGH (ref 70–99)
Potassium: 3.9 mmol/L (ref 3.5–5.1)
Sodium: 140 mmol/L (ref 135–145)
Total Bilirubin: 0.2 mg/dL — ABNORMAL LOW (ref 0.3–1.2)
Total Protein: 7.2 g/dL (ref 6.5–8.1)

## 2018-11-02 LAB — CBC
HCT: 39.5 % (ref 36.0–46.0)
Hemoglobin: 12.4 g/dL (ref 12.0–15.0)
MCH: 27.2 pg (ref 26.0–34.0)
MCHC: 31.4 g/dL (ref 30.0–36.0)
MCV: 86.6 fL (ref 80.0–100.0)
Platelets: 287 10*3/uL (ref 150–400)
RBC: 4.56 MIL/uL (ref 3.87–5.11)
RDW: 14.6 % (ref 11.5–15.5)
WBC: 5.1 10*3/uL (ref 4.0–10.5)
nRBC: 0 % (ref 0.0–0.2)

## 2018-11-02 LAB — I-STAT BETA HCG BLOOD, ED (MC, WL, AP ONLY): I-stat hCG, quantitative: <5 m[IU]/mL (ref <5)

## 2018-11-02 LAB — LIPASE, BLOOD: Lipase: 33 U/L (ref 11–51)

## 2018-11-02 MED ORDER — ONDANSETRON 8 MG PO TBDP: 8.0000 mg | ORAL_TABLET | Freq: Three times a day (TID) | ORAL | 0 refills | Status: DC | PRN

## 2018-11-02 MED ORDER — BENZONATATE 100 MG PO CAPS: 100.0000 mg | ORAL_CAPSULE | Freq: Three times a day (TID) | ORAL | 0 refills | Status: DC

## 2018-11-02 MED ORDER — SODIUM CHLORIDE 0.9 % IV BOLUS (SEPSIS)
500.0000 mL | Freq: Once | INTRAVENOUS | Status: AC
  Administered 2018-11-02: 500 mL via INTRAVENOUS

## 2018-11-02 MED ORDER — SODIUM CHLORIDE 0.9 % IV SOLN
1000.0000 mL | INTRAVENOUS | Status: DC
  Administered 2018-11-02: 1000 mL via INTRAVENOUS

## 2018-11-02 MED ORDER — ONDANSETRON HCL 4 MG/2ML IJ SOLN
4.0000 mg | Freq: Once | INTRAMUSCULAR | Status: AC
  Administered 2018-11-02: 4 mg via INTRAVENOUS
  Filled 2018-11-02: qty 2

## 2018-11-02 MED ORDER — SODIUM CHLORIDE 0.9% FLUSH: 3.0000 mL | Freq: Once | INTRAVENOUS | Status: DC

## 2018-11-02 NOTE — ED Notes (Signed)
Pt is resting, urine has been obtained and culture sent as well IV infusing to hydrate.  Chest X ray complete Pt has call light in reach.

## 2018-11-02 NOTE — ED Provider Notes (Signed)
[facility] DEPT Provider Note   CSN: [id number] Arrival date & time: 11/02/18  1628    History   Chief Complaint Chief Complaint  Patient presents with  . Emesis  . Nausea  . Diarrhea    HPI Gaylen A Broadfoot is a 53 y.o. female.     HPI The patient was diagnosed with COVID-19 on June 28.  Patient states in the last several days she has had worsening difficulty with poor appetite.  She began having episodes of nausea and vomiting today.  She has also had diarrhea.  Patient states she has had a few episodes of each.  She has no appetite and has not been able to eat or drink.  When she tries to eat or drink she vomits.  Patient started feeling weak and dehydrated.  She has been having persistent fevers.  Patient states she has moments where she feels like it is hard to catch her breath but she is not having any of the issues now and does not feel short of breath now. Past Medical History:  Diagnosis Date  . Constipation   . Female circumcision   . Headache(784.0)    migraines last one 09/28/11  . History of Helicobacter pylori infection   . Hyperlipemia   . Hypertension   . Incarcerated ventral hernia 08/28/2011   Repaired with mesh, 10/09/11   . PONV (postoperative nausea and vomiting)    pt denies    Patient Active Problem List   Diagnosis Date Noted  . Essential hypertension 05/31/2014  . Hypercholesteremia 05/31/2014  . Elevated prolactin level ([place]) 03/18/2013  . HYDRONEPHROSIS 01/26/2008  . ABDOMINAL PAIN, LEFT UPPER QUADRANT 12/22/2007  . ESOPHAGEAL STRICTURE 07/29/2007  . GERD 07/29/2007    Past Surgical History:  Procedure Laterality Date  . CESAREAN SECTION  2002/2005   X 2  . TONSILLECTOMY AND ADENOIDECTOMY  1987  . VENTRAL HERNIA REPAIR  10/09/2011   Procedure: HERNIA REPAIR VENTRAL ADULT;  Surgeon: Haywood Lasso, MD;  Location: [facility];  Service: General;  Laterality: N/A;  Repair of chronic incarcerated ventral hernia and  partial omentectomy with mesh.     OB History    Gravida  5   Para  2   Term  2   Preterm      AB  3   Living  2     SAB  3   TAB      Ectopic      Multiple      Live Births               Home Medications    Prior to Admission medications   Medication Sig Start Date End Date Taking? Authorizing Provider  acetaminophen (TYLENOL) 325 MG tablet Take 650 mg by mouth every 6 (six) hours as needed for moderate pain or headache.   Yes [provider]  cabergoline (DOSTINEX) 0.5 MG tablet Take 1/2 tablet twice a week by mouth 09/19/16  Yes Young, Candiss Norse, NP  Cetirizine HCl (ZYRTEC PO) Take 1 tablet by mouth daily.    Yes [provider]  Cholecalciferol (VITAMIN D) 2000 UNITS CAPS Take by mouth.   Yes [provider]  dicyclomine (BENTYL) 10 MG capsule Take 10 mg by mouth as needed for spasms.    Yes [provider]  Multiple Vitamin (MULTIVITAMIN) tablet Take 1 tablet by mouth daily.   Yes [provider]  tiZANidine (ZANAFLEX) 2 MG tablet Take 2 mg  by mouth 2 (two) times daily as needed for muscle spasms. 09/28/18  Yes [provider]  vitamin C (ASCORBIC ACID) 250 MG tablet Take 250 mg by mouth daily.   Yes [provider]  benzonatate (TESSALON) 100 MG capsule Take 1 capsule (100 mg total) by mouth every 8 (eight) hours. 11/02/18   Dorie Rank, MD  hydrocortisone-pramoxine [facility]) 2.5-1 % rectal cream Place 1 application rectally 3 (three) times daily. Patient not taking: Reported on 02/08/2017 08/08/15   Huel Cote, NP  Lidocaine-Hydrocortisone Ace 2.8-0.55 % GEL Apply rectally as needed Patient not taking: Reported on 02/08/2017 09/19/16   Huel Cote, NP  ondansetron (ZOFRAN ODT) 8 MG disintegrating tablet Take 1 tablet (8 mg total) by mouth every 8 (eight) hours as needed for nausea or vomiting. 11/02/18   Dorie Rank, MD    Family History Family History  Problem Relation Age of Onset  .  Hypertension Mother   . Anesthesia problems Neg Hx   . Colon cancer Neg Hx     Social History Social History   Tobacco Use  . Smoking status: Never Smoker  . Smokeless tobacco: Never Used  Substance Use Topics  . Alcohol use: No    Alcohol/week: 0.0 standard drinks  . Drug use: No     Allergies   Amoxicillin-pot clavulanate and Aspirin   Review of Systems Review of Systems  All other systems reviewed and are negative.    Physical Exam Updated Vital Signs BP (!) 164/85   Pulse 77   Temp 99.1 F (37.3 C) (Oral)   Resp (!) 22   SpO2 99%   Physical Exam Vitals signs and nursing note reviewed.  Constitutional:      Appearance: She is well-developed. She is not toxic-appearing or diaphoretic.  HENT:     Head: Normocephalic and atraumatic.     Right Ear: External ear normal.     Left Ear: External ear normal.     Mouth/Throat:     Comments: mucous membranes are dry Eyes:     General: No scleral icterus.       Right eye: No discharge.        Left eye: No discharge.     Conjunctiva/sclera: Conjunctivae normal.  Neck:     Musculoskeletal: Neck supple.     Trachea: No tracheal deviation.  Cardiovascular:     Rate and Rhythm: Normal rate and regular rhythm.  Pulmonary:     Effort: Pulmonary effort is normal. No respiratory distress.     Breath sounds: Normal breath sounds. No stridor. No wheezing or rales.  Abdominal:     General: Bowel sounds are normal. There is no distension.     Palpations: Abdomen is soft.     Tenderness: There is no abdominal tenderness. There is no guarding or rebound.  Musculoskeletal:        General: No tenderness.  Skin:    General: Skin is warm and dry.     Findings: No rash.  Neurological:     Cranial Nerves: No cranial nerve deficit (no facial droop, extraocular movements intact, no slurred speech).     Sensory: No sensory deficit.     Motor: No abnormal muscle tone or seizure activity.     Coordination: Coordination normal.       ED Treatments / Results  Labs (all labs ordered are listed, but only abnormal results are displayed) Labs Reviewed  COMPREHENSIVE METABOLIC PANEL - Abnormal; Notable for the following components:  Result Value   Glucose, Bld 103 (*)    Total Bilirubin 0.2 (*)    All other components within normal limits  URINALYSIS, ROUTINE W REFLEX MICROSCOPIC - Abnormal; Notable for the following components:   Color, Urine STRAW (*)    Specific Gravity, Urine 1.003 (*)    All other components within normal limits  LIPASE, BLOOD  CBC  I-STAT BETA HCG BLOOD, ED ([facility], [facility], [facility] ONLY)    EKG None  Radiology Dg Chest Portable 1 View  Result Date: 11/02/2018 CLINICAL DATA:  COVID-19 positive.  Weakness. EXAM: PORTABLE CHEST 1 VIEW COMPARISON:  10/02/2011 chest radiograph. FINDINGS: Stable cardiomediastinal silhouette with normal heart size. No pneumothorax. No pleural effusion. No pulmonary edema. No acute consolidative airspace disease. Minimal curvilinear right basilar scarring versus atelectasis. IMPRESSION: Minimal curvilinear right basilar scarring versus atelectasis. Otherwise no active disease. Electronically Signed   By: Ilona Sorrel M.D.   On: 11/02/2018 18:23    Procedures Procedures (including critical care time)  Medications Ordered in ED Medications  sodium chloride flush (NS) 0.9 % injection 3 mL (3 mLs Intravenous Not Given 11/02/18 1710)  sodium chloride 0.9 % bolus 500 mL (500 mLs Intravenous New Bag/Given 11/02/18 1759)    Followed by  0.9 %  sodium chloride infusion (1,000 mLs Intravenous New Bag/Given 11/02/18 1759)  ondansetron (ZOFRAN) injection 4 mg (4 mg Intravenous Given 11/02/18 1759)     Initial Impression / Assessment and Plan / ED Course  I have reviewed the triage vital signs and the nursing notes.  Pertinent labs & imaging results that were available during my care of the patient were reviewed by me and considered in my medical decision making (see chart for  details).  Clinical Course as of Nov 01 1948  Sun Nov 02, 2018  1717 Positive COvid JUne 28th at [facility] in care everywhere   [JK]    Clinical Course User Index [JK] Dorie Rank, MD     Patient's labs do not show any significant abnormalities.  No signs of dehydration.  Chest x-ray does not show any significant pneumonia.  Vital signs have remained stable.  No hypoxia.  Patient was gently hydrated considering her anorexia and diarrhea.  No further episodes of vomiting or diarrhea here in the ED.  Patient appears stable for discharge continued supportive care.  Elmarie A Spelman was evaluated in Emergency Department on 11/02/2018 for the symptoms described in the history of present illness. She was evaluated in the context of the global COVID-19 pandemic, which necessitated consideration that the patient might be at risk for infection with the SARS-CoV-2 virus that causes COVID-19. Institutional protocols and algorithms that pertain to the evaluation of patients at risk for COVID-19 are in a state of rapid change based on information released by regulatory bodies including the CDC and federal and state organizations. These policies and algorithms were followed during the patient's care in the ED.  Final Clinical Impressions(s) / ED Diagnoses   Final diagnoses:  [id number]-[id number] virus infection  Dehydration    ED Discharge Orders         Ordered    ondansetron (ZOFRAN ODT) 8 MG disintegrating tablet  Every 8 hours PRN     11/02/18 1936    benzonatate (TESSALON) 100 MG capsule  Every 8 hours     11/02/18 1936           Dorie Rank, MD 11/02/18 1950

## 2018-11-02 NOTE — ED Triage Notes (Signed)
EMS reports family called due to pt not eating due to vomiting and nausea. Diarrhea yesterday, dizziness, staying in bed. C/o sore throat and cough. Covid + June 28 . IV and VS documented. Has been taking Tylenol prn.

## 2018-11-02 NOTE — ED Notes (Signed)
Bed: BT66 Expected date:  Expected time:  Means of arrival:  Comments: + Covid

## 2018-11-02 NOTE — Discharge Instructions (Signed)
Take the medications for nausea and cough to help with your symptoms, continue Tylenol for your headache, return as needed for worsening symptoms.

## 2019-07-07 ENCOUNTER — Ambulatory Visit (INDEPENDENT_AMBULATORY_CARE_PROVIDER_SITE_OTHER): Payer: 59

## 2019-07-07 ENCOUNTER — Ambulatory Visit (INDEPENDENT_AMBULATORY_CARE_PROVIDER_SITE_OTHER): Payer: 59 | Admitting: Orthopaedic Surgery

## 2019-07-07 ENCOUNTER — Other Ambulatory Visit: Payer: Self-pay

## 2019-07-07 ENCOUNTER — Encounter: Payer: Self-pay | Admitting: Orthopaedic Surgery

## 2019-07-07 VITALS — Ht 67.0 in | Wt 165.0 lb

## 2019-07-07 DIAGNOSIS — G8929 Other chronic pain: Secondary | ICD-10-CM | POA: Diagnosis not present

## 2019-07-07 DIAGNOSIS — M25511 Pain in right shoulder: Secondary | ICD-10-CM | POA: Insufficient documentation

## 2019-07-07 DIAGNOSIS — M542 Cervicalgia: Secondary | ICD-10-CM | POA: Insufficient documentation

## 2019-07-07 MED ORDER — METHYLPREDNISOLONE ACETATE 40 MG/ML IJ SUSP
80.0000 mg | INTRAMUSCULAR | Status: AC | PRN
  Administered 2019-07-07: 80 mg via INTRA_ARTICULAR

## 2019-07-07 MED ORDER — LIDOCAINE HCL 2 % IJ SOLN
2.0000 mL | INTRAMUSCULAR | Status: AC | PRN
  Administered 2019-07-07: 2 mL

## 2019-07-07 MED ORDER — BUPIVACAINE HCL 0.5 % IJ SOLN
2.0000 mL | INTRAMUSCULAR | Status: AC | PRN
  Administered 2019-07-07: 2 mL via INTRA_ARTICULAR

## 2019-07-07 NOTE — Progress Notes (Signed)
Office Visit Note   Patient: Hannah Alvarez           Date of Birth: 1965-11-10           MRN: [id number] Visit Date: 07/07/2019              Requested by: Shirline Frees, MD [address] [place],  [place] [id number] PCP: Shirline Frees, MD   Assessment & Plan: Visit Diagnoses:  1. Chronic right shoulder pain   2. 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  Present problem appears to be related to the right shoulder without referred pain from the cervical spine.  Has impingement syndrome but negative films.  I will inject the subacromial space and monitor response over the next several weeks.  Either consider follow-up injections or MRI scan to rule out pathology  Follow-Up Instructions: Return if symptoms worsen or fail to improve.   Orders:  Orders Placed This Encounter  Procedures  . Large Joint Inj: R subacromial bursa  . XR Shoulder Right   No orders of the defined types were placed in this encounter.     Procedures: Large Joint Inj: R subacromial bursa on 07/07/2019 4:07 PM Indications: pain and diagnostic evaluation Details: 25 G 1.5 in needle, anterolateral approach  Arthrogram: No  Medications: 2 mL lidocaine 2 %; 2 mL bupivacaine 0.5 %; 80 mg methylPREDNISolone acetate 40 MG/ML Consent was given by the patient. Immediately prior to procedure a time out was called to verify the correct patient, procedure, equipment, support staff and site/side marked as required. Patient was prepped and draped in the usual sterile fashion.       Clinical Data: No additional findings.   Subjective: Chief Complaint  Patient presents with  . Neck - Pain  Patient presents today with neck pain. She said that she had a cold in December of 2019 and after her cold resolved she continued to hurt on  the right side of her neck. She had x-rays at [facility] in February of 2020. She also had an MRI done at [facility] in April of 2020. No numbness, tingling, or weakness in her upper extremities. She also states that her right shoulder started hurting around the same time. Her pain is located superiorly. Her pain is worse at night and wakes her up. She has taken tylenol for pain.  Does have some difficulty raising her arm over her head.  Pain seems to be separate from his cervical spine  HPI  Review of Systems  Constitutional: Negative for fatigue.  HENT: Negative for ear pain.   Eyes: Negative for pain.  Respiratory: Negative for shortness of breath.   Cardiovascular: Negative for leg swelling.  Gastrointestinal: Negative for constipation and diarrhea.  Genitourinary: Negative for difficulty urinating.  Musculoskeletal: Negative for joint swelling.  Skin: Negative for rash.  Allergic/Immunologic: Negative for food allergies.  Neurological: Negative for weakness.  Hematological: Does not bruise/bleed easily.  Psychiatric/Behavioral: Positive for sleep disturbance.     Objective: Vital Signs: Ht 5\' 7"  (1.702 m)   Wt 165 lb (74.8 kg)   BMI 25.84 kg/m   Physical Exam Constitutional:      Appearance: She is well-developed.  Eyes:     Pupils: Pupils are equal, round, and reactive to light.  Pulmonary:     Effort: Pulmonary effort is normal.  Skin:  General: Skin is warm and dry.  Neurological:     Mental Status: She is alert and oriented to person, place, and time.  Psychiatric:        Behavior: Behavior normal.     Ortho Exam painless range of motion of cervical spine in flexion extension and rotation.  Very minimal loss of full neck extension.  No referred pain to either upper extremity with neck extension.  No grinding or grating.  Very minimal discomfort without motion. Positive impingement and minimally positive empty can testing right shoulder.  Biceps intact.   Great strength.  Good grip and release.  Full overhead motion with some mild discomfort  Specialty Comments:  No specialty comments available.  Imaging: XR Shoulder Right  Result Date: 07/07/2019 Films of the right shoulder obtained in several projections.  No ectopic calcification or acute change.  Humeral head is centered in the glenoid.  Normal space between the humeral head and the acromion.  Mild lateral downsloping of the acromion.  Mild degenerative changes at the [facility] joint.  Patient experiencing symptoms of impingement    PMFS History: Patient Active Problem List   Diagnosis Date Noted  . Pain in right shoulder 07/07/2019  . Cervicalgia 07/07/2019  . Essential hypertension 05/31/2014  . Hypercholesteremia 05/31/2014  . Elevated prolactin level 03/18/2013  . HYDRONEPHROSIS 01/26/2008  . ABDOMINAL PAIN, LEFT UPPER QUADRANT 12/22/2007  . ESOPHAGEAL STRICTURE 07/29/2007  . GERD 07/29/2007   Past Medical History:  Diagnosis Date  . Constipation   . Female circumcision   . Headache(784.0)    migraines last one 09/28/11  . History of Helicobacter pylori infection   . Hyperlipemia   . Hypertension   . Incarcerated ventral hernia 08/28/2011   Repaired with mesh, 10/09/11   . PONV (postoperative nausea and vomiting)    pt denies    Family History  Problem Relation Age of Onset  . Hypertension Mother   . Anesthesia problems Neg Hx   . Colon cancer Neg Hx     Past Surgical History:  Procedure Laterality Date  . CESAREAN SECTION  2002/2005   X 2  . TONSILLECTOMY AND ADENOIDECTOMY  1987  . VENTRAL HERNIA REPAIR  10/09/2011   Procedure: HERNIA REPAIR VENTRAL ADULT;  Surgeon: Haywood Lasso, MD;  Location: [facility];  Service: General;  Laterality: N/A;  Repair of chronic incarcerated ventral hernia and partial omentectomy with mesh.   Social History   Occupational History  . Occupation: Ship broker  Tobacco Use  . Smoking status: Never Smoker  . Smokeless tobacco: Never Used   Substance and Sexual Activity  . Alcohol use: No    Alcohol/week: 0.0 standard drinks  . Drug use: No  . Sexual activity: Yes    Birth control/protection: None

## 2019-07-27 ENCOUNTER — Other Ambulatory Visit: Payer: Self-pay

## 2019-07-28 ENCOUNTER — Ambulatory Visit (INDEPENDENT_AMBULATORY_CARE_PROVIDER_SITE_OTHER): Payer: 59 | Admitting: Women's Health

## 2019-07-28 ENCOUNTER — Encounter: Payer: Self-pay | Admitting: Women's Health

## 2019-07-28 VITALS — BP 140/80 | Ht 67.0 in | Wt 159.0 lb

## 2019-07-28 DIAGNOSIS — Z01419 Encounter for gynecological examination (general) (routine) without abnormal findings: Secondary | ICD-10-CM | POA: Diagnosis not present

## 2019-07-28 NOTE — Patient Instructions (Addendum)
A&D ointment twice a day Pleasure knowing you! Health Maintenance, Female Adopting a healthy lifestyle and getting preventive care are important in promoting health and wellness. Ask your health care provider about:  The right schedule for you to have regular tests and exams.  Things you can do on your own to prevent diseases and keep yourself healthy. What should I know about diet, weight, and exercise? Eat a healthy diet   Eat a diet that includes plenty of vegetables, fruits, low-fat dairy products, and lean protein.  Do not eat a lot of foods that are high in solid fats, added sugars, or sodium. Maintain a healthy weight Body mass index (BMI) is used to identify weight problems. It estimates body fat based on height and weight. Your health care provider can help determine your BMI and help you achieve or maintain a healthy weight. Get regular exercise Get regular exercise. This is one of the most important things you can do for your health. Most adults should:  Exercise for at least 150 minutes each week. The exercise should increase your heart rate and make you sweat (moderate-intensity exercise).  Do strengthening exercises at least twice a week. This is in addition to the moderate-intensity exercise.  Spend less time sitting. Even light physical activity can be beneficial. Watch cholesterol and blood lipids Have your blood tested for lipids and cholesterol at 56 years of age, then have this test every 5 years. Have your cholesterol levels checked more often if:  Your lipid or cholesterol levels are high.  You are older than 56 years of age.  You are at high risk for heart disease. What should I know about cancer screening? Depending on your health history and family history, you may need to have cancer screening at various ages. This may include screening for:  Breast cancer.  Cervical cancer.  Colorectal cancer.  Skin cancer.  Lung cancer. What should I know  about heart disease, diabetes, and high blood pressure? Blood pressure and heart disease  High blood pressure causes heart disease and increases the risk of stroke. This is more likely to develop in people who have high blood pressure readings, are of African descent, or are overweight.  Have your blood pressure checked: ? Every 3-5 years if you are 41-51 years of age. ? Every year if you are 55 years old or older. Diabetes Have regular diabetes screenings. This checks your fasting blood sugar level. Have the screening done:  Once every three years after age 11 if you are at a normal weight and have a low risk for diabetes.  More often and at a younger age if you are overweight or have a high risk for diabetes. What should I know about preventing infection? Hepatitis B If you have a higher risk for hepatitis B, you should be screened for this virus. Talk with your health care provider to find out if you are at risk for hepatitis B infection. Hepatitis C Testing is recommended for:  Everyone born from [birth year] through 1967.  Anyone with known risk factors for hepatitis C. Sexually transmitted infections (STIs)  Get screened for STIs, including gonorrhea and chlamydia, if: ? You are sexually active and are younger than 56 years of age. ? You are older than 56 years of age and your health care provider tells you that you are at risk for this type of infection. ? Your sexual activity has changed since you were last screened, and you are at increased risk for chlamydia  or gonorrhea. Ask your health care provider if you are at risk.  Ask your health care provider about whether you are at high risk for HIV. Your health care provider may recommend a prescription medicine to help prevent HIV infection. If you choose to take medicine to prevent HIV, you should first get tested for HIV. You should then be tested every 3 months for as long as you are taking the medicine. Pregnancy  If you are about  to stop having your period (premenopausal) and you may become pregnant, seek counseling before you get pregnant.  Take 400 to 800 micrograms (mcg) of folic acid every day if you become pregnant.  Ask for birth control (contraception) if you want to prevent pregnancy. Osteoporosis and menopause Osteoporosis is a disease in which the bones lose minerals and strength with aging. This can result in bone fractures. If you are 47 years old or older, or if you are at risk for osteoporosis and fractures, ask your health care provider if you should:  Be screened for bone loss.  Take a calcium or vitamin D supplement to lower your risk of fractures.  Be given hormone replacement therapy (HRT) to treat symptoms of menopause. Follow these instructions at home: Lifestyle  Do not use any products that contain nicotine or tobacco, such as cigarettes, e-cigarettes, and chewing tobacco. If you need help quitting, ask your health care provider.  Do not use street drugs.  Do not share needles.  Ask your health care provider for help if you need support or information about quitting drugs. Alcohol use  Do not drink alcohol if: ? Your health care provider tells you not to drink. ? You are pregnant, may be pregnant, or are planning to become pregnant.  If you drink alcohol: ? Limit how much you use to 0-1 drink a day. ? Limit intake if you are breastfeeding.  Be aware of how much alcohol is in your drink. In the [place] one drink equals one 12 oz bottle of beer (355 mL), one 5 oz glass of wine (148 mL), or one 1 oz glass of hard liquor (44 mL). General instructions  Schedule regular health, dental, and eye exams.  Stay current with your vaccines.  Tell your health care provider if: ? You often feel depressed. ? You have ever been abused or do not feel safe at home. Summary  Adopting a healthy lifestyle and getting preventive care are important in promoting health and wellness.  Follow your  health care provider's instructions about healthy diet, exercising, and getting tested or screened for diseases.  Follow your health care provider's instructions on monitoring your cholesterol and blood pressure. This information is not intended to replace advice given to you by your health care provider. Make sure you discuss any questions you have with your health care provider. Document Revised: 04/09/2018 Document Reviewed: 04/09/2018 Elsevier Patient Education  2020 [employer].

## 2019-07-28 NOTE — Progress Notes (Signed)
Hannah Alvarez Dec 15, 1965 [id number]    History:    Presents for annual exam.  Continues with mostly monthly cycles with no menopausal symptoms.  Normal Pap and mammogram history.  2015 tubular adenoma 5-year follow-up in 2020 - colonoscopy.  History of increased prolactin was on Dostinex in the past managed at [facility] normal prolactin level.  Primary care manages  GERD, history of hypertension no longer on medication.  Had Covid 09/2018.  Past medical history, past surgical history, family history and social history were all reviewed and documented in the EPIC chart.  Education officer, museum.  2 sons ages 26 and 54 both doing well.  Is from [place].  ROS:  A ROS was performed and pertinent positives and negatives are included.  Exam:  Vitals:   07/28/19 1031  BP: 140/80  Weight: 159 lb (72.1 kg)  Height: 5\' 7"  (1.702 m)   Body mass index is 24.9 kg/m.   General appearance:  Normal Thyroid:  Symmetrical, normal in size, without palpable masses or nodularity. Respiratory  Auscultation:  Clear without wheezing or rhonchi Cardiovascular  Auscultation:  Regular rate, without rubs, murmurs or gallops  Edema/varicosities:  Not grossly evident Abdominal  Soft,nontender, without masses, guarding or rebound.  Liver/spleen:  No organomegaly noted  Hernia:  None appreciated  Skin  Inspection:  Grossly normal   Breasts: Examined lying and sitting.     Right: Without masses, retractions, discharge or axillary adenopathy.     Left: Without masses, retractions, discharge or axillary adenopathy. 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  Midline and mobile  Adnexa/parametria:     Rt: Without masses or tenderness.   Lt: Without masses or tenderness.  Anus and  perineum: Normal  Digital rectal exam: Normal sphincter tone without palpated masses or tenderness  Assessment/Plan:  54 y.o. MBF G5, P2 for annual exam with no GYN complaints.  Perimenopausal has skipped several cycles in the past year/ prior cycles monthly GERD-primary care manages labs and meds History of hyperprolactinemia [facility] manages no longer on medication  Plan: Menopause reviewed, reviewed most likely cycles will stop this year.  Has occasional hot flushes.  SBEs, continue annual 3D screening mammogram, calcium rich foods, vitamin D 2000 IUs daily.  Aware of importance of regular weightbearing and balance type exercise.  Self-care, leisure activities encouraged.  Pap with HR HPV typing, new screening guidelines reviewed.  Last Pap 2017.    [place], 6:01 PM 07/28/2019

## 2019-07-30 LAB — PAP, TP IMAGING W/ HPV RNA, RFLX HPV TYPE 16,18/45: HPV DNA High Risk: NOT DETECTED

## 2020-02-03 NOTE — Congregational Nurse Program (Signed)
  Dept: [place] Nurse Program Note  Date of Encounter: 02/03/2020  Past Medical History: Past Medical History:  Diagnosis Date  . Constipation   . Female circumcision   . Headache(784.0)    migraines last one 09/28/11  . History of Helicobacter pylori infection   . Hyperlipemia   . Hypertension   . Incarcerated ventral hernia 08/28/2011   Repaired with mesh, 10/09/11   . PONV (postoperative nausea and vomiting)    pt denies    Encounter Details:  CNP Questionnaire - 02/03/20 1352      Questionnaire   Do you give verbal consent to treat you today? Yes    Visit Setting Other    Location Patient Served At [facility]    Patient Status Immigrant    Medical Provider Yes    Insurance Private Insurance    Intervention Assess (including screenings)    ED Visit Averted Yes          Client requested Blood Pressure check. BP 153/87 and pulse 75. Client said she had headache for 3 days but does not have a headache today. She saw her PCP 3 weeks ago. CNP encouraged client to call her physician to inform of BP. She is not currently taking any blood pressure medications. She has taken them in the past. CNP encouraged client to come back weekly to check BP. Marguerite Olea, RN, BSN, CNP [phone].

## 2020-02-10 NOTE — Congregational Nurse Program (Signed)
  Dept: [facility] Nurse Program Note  Date of Encounter: 02/10/2020  Past Medical History: Past Medical History:  Diagnosis Date  . Constipation   . Female circumcision   . Headache(784.0)    migraines last one 09/28/11  . History of Helicobacter pylori infection   . Hyperlipemia   . Hypertension   . Incarcerated ventral hernia 08/28/2011   Repaired with mesh, 10/09/11   . PONV (postoperative nausea and vomiting)    pt denies    Encounter Details:  CNP Questionnaire - 02/10/20 1333      Questionnaire   Do you give verbal consent to treat you today? Yes    Visit Setting Other    Location Patient Served At [facility] [place]    Patient Status Immigrant    Medical Provider Yes    Insurance Private Insurance    Intervention Assess (including screenings);Educate          Client requested blood pressure check. She went to her doctor last week after coming for BP check. Today BP 143/90 and pulse 76. CNP inquired how she is sleeping. She sleeps 12am-6am and wants to do better getting more sleep. She also commented that she is not exercising regularly and wants to start exercising. Marguerite Olea, RN, BSN, CNP [phone]

## 2020-04-06 ENCOUNTER — Other Ambulatory Visit: Payer: Self-pay

## 2020-04-06 ENCOUNTER — Ambulatory Visit (INDEPENDENT_AMBULATORY_CARE_PROVIDER_SITE_OTHER): Payer: 59

## 2020-04-06 ENCOUNTER — Ambulatory Visit (INDEPENDENT_AMBULATORY_CARE_PROVIDER_SITE_OTHER): Payer: 59 | Admitting: Orthopaedic Surgery

## 2020-04-06 ENCOUNTER — Encounter: Payer: Self-pay | Admitting: Orthopaedic Surgery

## 2020-04-06 VITALS — Ht 67.0 in | Wt 165.0 lb

## 2020-04-06 DIAGNOSIS — M79604 Pain in right leg: Secondary | ICD-10-CM | POA: Insufficient documentation

## 2020-04-06 MED ORDER — CELECOXIB 200 MG PO CAPS: ORAL_CAPSULE | ORAL | 0 refills | Status: DC

## 2020-04-06 NOTE — Progress Notes (Signed)
Office Visit Note   Patient: Hannah Alvarez           Date of Birth: 11-07-1965           MRN: [id number] Visit Date: 04/06/2020              Requested by: Shirline Frees, MD [address],  [place] [address] PCP: Shirline Frees, MD   Assessment & Plan: Visit Diagnoses:  1. Pain in right leg     Plan: Ms Buth's right-sided pain appears to be abating.  Initially she had pain along the right side of her face torso and even her right leg.  Neurologically she is intact.  She is not having any appreciable pain other than some areas of discomfort along the lateral thigh muscular right hip.  Films are negative.  Not sure the etiology of her pain but it seems to be improving.  No neurologic findings. She has had Celebrex in the past that helped and I will renew this.  She has had prior MRI scan of her neck with some degenerative changes.  She even had a CT scan of her pelvis through another office in the past which I reviewed did not find any particular abnormalities.  I would like her to call me in the next week or 10 days if not any improvement and return for reevaluation.  At this point I do not have an explanation for her pain  Follow-Up Instructions: No follow-ups on file.   Orders:  Orders Placed This Encounter  Procedures  . XR FEMUR, MIN 2 VIEWS RIGHT   No orders of the defined types were placed in this encounter.     Procedures: No procedures performed   Clinical Data: No additional findings.   Subjective: Chief Complaint  Patient presents with  . Right Arm - Pain  . Right Leg - Pain  Patient presents today for right sided body pain. She said that she woke up three days ago with right sided body pain. She said that the right side of her head, right arm, right torso, and right leg are painful. No injury. She recently slept on a firmer mattress, and wonders if that caused it. No numbness or tingling. No back pain. She has been trying Aleve and  [place] Cream for pain relief.  Feeling better over the last day or so without any referable neck pain or shoulder discomfort.  She is not having any pain in her right upper extremity and not having any headaches at present.  She denies any fever or chills or shortness of breath or chest pain.  She has not had any rashes.  Her only problem at present is some "muscle" pain in the anterior and lateral right thigh.  No related back pain.  No viral symptoms. In the past she has been evaluated by primary care at [facility].  She has had a prescription for Celebrex which seems to have helped. HPI  Review of Systems   Objective: Vital Signs: Ht 5\' 7"  (1.702 m)   Wt 165 lb (74.8 kg)   BMI 25.84 kg/m   Physical Exam Constitutional:      Appearance: She is well-developed.  Eyes:     Pupils: Pupils are equal, round, and reactive to light.  Pulmonary:     Effort: Pulmonary effort is normal.  Skin:    General: Skin is warm and dry.  Neurological:     Mental Status: She is alert  and oriented to person, place, and time.  Psychiatric:        Behavior: Behavior normal.     Ortho Exam awake alert and oriented x3 comfortable sitting and even walking.  She notes that when she is ambulating she will have some discomfort in her anterior lateral thigh musculature and even the mid thigh musculature.  She did have some areas of trigger point tenderness but not severe.  She walks without a limp.  No knee pain or knee effusion.  Motor and sensory exam intact.  No percussible tenderness of the lumbar spine or the sacroiliac joints.  No local tenderness over the greater trochanter nor any symptoms referable to her right hip.  There was no pain with internal and external rotation.  Full range of motion of the cervical spine without any referred pain or discomfort with that motion.  She had full neck extension and able to touch  her chin to her chest.  Full range of motion of right shoulder without any  referred discomfort or pain with impingement testing.  Negative empty can testing.  No localized areas of tenderness.  Good strength.  Good grip and release.  No obvious weakness on right compared to left side.  Specialty Comments:  No specialty comments available.  Imaging: XR FEMUR, MIN 2 VIEWS RIGHT  Result Date: 04/06/2020 Films of the right femur obtained in multiple projections and included the right hip and knee.  There were no acute changes.  Did not see any appreciable arthritis of the right hip.  There is some calcification of the capsule and some calcification along the lateral acetabulum but no acute changes.  The joint space was well-maintained.  No abnormalities over the greater trochanter.  Films of the femur did not demonstrate any bony lesions    PMFS History: Patient Active Problem List   Diagnosis Date Noted  . Pain in right leg 04/06/2020  . Pain in right shoulder 07/07/2019  . Cervicalgia 07/07/2019  . Essential hypertension 05/31/2014  . Hypercholesteremia 05/31/2014  . Elevated prolactin level 03/18/2013  . HYDRONEPHROSIS 01/26/2008  . ABDOMINAL PAIN, LEFT UPPER QUADRANT 12/22/2007  . ESOPHAGEAL STRICTURE 07/29/2007  . GERD 07/29/2007   Past Medical History:  Diagnosis Date  . Constipation   . Female circumcision   . Headache(784.0)    migraines last one 09/28/11  . History of Helicobacter pylori infection   . Hyperlipemia   . Hypertension   . Incarcerated ventral hernia 08/28/2011   Repaired with mesh, 10/09/11   . PONV (postoperative nausea and vomiting)    pt denies    Family History  Problem Relation Age of Onset  . Hypertension Mother   . Anesthesia problems Neg Hx   . Colon cancer Neg Hx     Past Surgical History:  Procedure Laterality Date  . CESAREAN SECTION  2002/2005   X 2  . TONSILLECTOMY AND ADENOIDECTOMY  1987  . VENTRAL HERNIA REPAIR  10/09/2011   Procedure: HERNIA REPAIR VENTRAL ADULT;  Surgeon: Haywood Lasso, MD;  Location:  [facility];  Service: General;  Laterality: N/A;  Repair of chronic incarcerated ventral hernia and partial omentectomy with mesh.   Social History   Occupational History  . Occupation: Ship broker  Tobacco Use  . Smoking status: Never Smoker  . Smokeless tobacco: Never Used  Vaping Use  . Vaping Use: Never used  Substance and Sexual Activity  . Alcohol use: No    Alcohol/week: 0.0 standard drinks  . Drug use:  No  . Sexual activity: Yes    Birth control/protection: None

## 2020-04-06 NOTE — Addendum Note (Signed)
Addended by: Lendon Collar on: 04/06/2020 02:30 PM   Modules accepted: Orders

## 2020-05-05 ENCOUNTER — Other Ambulatory Visit: Payer: 59

## 2020-05-30 ENCOUNTER — Telehealth: Payer: Self-pay | Admitting: Gastroenterology

## 2020-05-30 NOTE — Telephone Encounter (Signed)
Pt saw [facility] last year and would like to transfer care to [place] for obtaining better care, will try to fax over records to review.

## 2020-06-20 NOTE — Telephone Encounter (Signed)
Disregard msg, pt was last seen In 2019

## 2020-08-02 ENCOUNTER — Encounter: Payer: Self-pay | Admitting: Gastroenterology

## 2020-08-02 ENCOUNTER — Ambulatory Visit: Payer: 59 | Admitting: Gastroenterology

## 2020-08-02 ENCOUNTER — Other Ambulatory Visit (INDEPENDENT_AMBULATORY_CARE_PROVIDER_SITE_OTHER): Payer: 59

## 2020-08-02 VITALS — BP 146/82 | HR 76 | Ht 67.0 in | Wt 168.4 lb

## 2020-08-02 DIAGNOSIS — R1013 Epigastric pain: Secondary | ICD-10-CM

## 2020-08-02 DIAGNOSIS — R1032 Left lower quadrant pain: Secondary | ICD-10-CM

## 2020-08-02 DIAGNOSIS — R1012 Left upper quadrant pain: Secondary | ICD-10-CM

## 2020-08-02 DIAGNOSIS — K219 Gastro-esophageal reflux disease without esophagitis: Secondary | ICD-10-CM

## 2020-08-02 DIAGNOSIS — R109 Unspecified abdominal pain: Secondary | ICD-10-CM

## 2020-08-02 DIAGNOSIS — Z86010 Personal history of colonic polyps: Secondary | ICD-10-CM

## 2020-08-02 LAB — CBC
HCT: 37.3 % (ref 36.0–46.0)
Hemoglobin: 12.5 g/dL (ref 12.0–15.0)
MCHC: 33.4 g/dL (ref 30.0–36.0)
MCV: 85 fl (ref 78.0–100.0)
Platelets: 316 10*3/uL (ref 150.0–400.0)
RBC: 4.39 Mil/uL (ref 3.87–5.11)
RDW: 14.4 % (ref 11.5–15.5)
WBC: 3.9 10*3/uL — ABNORMAL LOW (ref 4.0–10.5)

## 2020-08-02 LAB — COMPREHENSIVE METABOLIC PANEL
ALT: 21 U/L (ref 0–35)
AST: 20 U/L (ref 0–37)
Albumin: 4.2 g/dL (ref 3.5–5.2)
Alkaline Phosphatase: 78 U/L (ref 39–117)
BUN: 12 mg/dL (ref 6–23)
CO2: 26 mEq/L (ref 19–32)
Calcium: 9.4 mg/dL (ref 8.4–10.5)
Chloride: 104 mEq/L (ref 96–112)
Creatinine, Ser: 0.62 mg/dL (ref 0.40–1.20)
GFR: 99.28 mL/min (ref >60.00)
Glucose, Bld: 108 mg/dL — ABNORMAL HIGH (ref 70–99)
Potassium: 3.8 mEq/L (ref 3.5–5.1)
Sodium: 140 mEq/L (ref 135–145)
Total Bilirubin: 0.2 mg/dL (ref 0.2–1.2)
Total Protein: 6.8 g/dL (ref 6.0–8.3)

## 2020-08-02 LAB — C-REACTIVE PROTEIN: CRP: 1.2 mg/dL (ref 0.5–20.0)

## 2020-08-02 LAB — AMYLASE: Amylase: 30 U/L (ref 27–131)

## 2020-08-02 LAB — LIPASE: Lipase: 31 U/L (ref 11.0–59.0)

## 2020-08-02 LAB — TSH: TSH: 1.16 u[IU]/mL (ref 0.35–4.50)

## 2020-08-02 NOTE — Patient Instructions (Signed)
If you are age 57 or younger, your body mass index should be between 19-25. Your Body mass index is 26.38 kg/m. If this is out of the aformentioned range listed, please consider follow up with your Primary Care Provider.   Your provider has requested that you go to the basement level for lab work before leaving today. Press "B" on the elevator. The lab is located at the first door on the left as you exit the elevator.   Due to recent changes in healthcare laws, you may see the results of your imaging and laboratory studies on MyChart before your provider has had a chance to review them.  We understand that in some cases there may be results that are confusing or concerning to you. Not all laboratory results come back in the same time frame and the provider may be waiting for multiple results in order to interpret others.  Please give [place] 48 hours in order for your provider to thoroughly review all the results before contacting the office for clarification of your results.   You have been scheduled for an endoscopy. Please follow written instructions given to you at your visit today. If you use inhalers (even only as needed), please bring them with you on the day of your procedure.   You have been scheduled for an abdominal ultrasound at [facility] (1st floor of hospital) on 08/08/20 at 9:00am. Please arrive 15 minutes prior to your appointment for registration. Make certain not to have anything to eat or drink 6 hours prior to your appointment. Should you need to reschedule your appointment, please contact radiology at [phone]. This test typically takes about 30 minutes to perform.  Thank you for choosing me and [facility].  Dr. Rush Landmark

## 2020-08-03 LAB — TISSUE TRANSGLUTAMINASE, IGA: (tTG) Ab, IgA: <1 U/mL

## 2020-08-03 LAB — IGA: Immunoglobulin A: 223 mg/dL (ref 47–310)

## 2020-08-05 ENCOUNTER — Encounter: Payer: Self-pay | Admitting: Gastroenterology

## 2020-08-05 DIAGNOSIS — R1012 Left upper quadrant pain: Secondary | ICD-10-CM | POA: Insufficient documentation

## 2020-08-05 DIAGNOSIS — R109 Unspecified abdominal pain: Secondary | ICD-10-CM | POA: Insufficient documentation

## 2020-08-05 DIAGNOSIS — R1013 Epigastric pain: Secondary | ICD-10-CM | POA: Insufficient documentation

## 2020-08-05 DIAGNOSIS — Z860100 Personal history of colon polyps, unspecified: Secondary | ICD-10-CM | POA: Insufficient documentation

## 2020-08-05 DIAGNOSIS — R1032 Left lower quadrant pain: Secondary | ICD-10-CM | POA: Insufficient documentation

## 2020-08-05 NOTE — Progress Notes (Signed)
[facility] VISIT   Primary Care Provider Shirline Frees, MD [address] [place] [address] [phone]  Referring Provider Shirline Frees, MD [address] [place],  [place] [address] [phone]  Patient Profile: Hannah Alvarez is a 55 y.o. female with a pmh significant for hypertension, hyperlipidemia, ventral hernia (status post repair), GERD, reported prior H. pylori infection, previous constipation, previous anal fissure (status post ?sphincterotomy).  The patient presents to the [facility] for an evaluation and management of problem(s) noted below:  Problem List 1. LUQ pain   2. Dyspepsia   3. Gastroesophageal reflux disease, unspecified whether esophagitis present   4. LLQ pain   5. Abdominal cramping   6. History of colonic polyps     History of Present Illness This is the patient's first visit to the outpatient [facility] clinic in years.  She previously had seen Dr. Sharlett Iles and then due to insurance issues was seen by Dr. Watt Climes of [facility], and had recent colonoscopy at [facility] in 2020.  Her insurance is changed once again and she is here to establish for GI care.  Patient describes a longer standing history of acid reflux.  She has used medication since that period in time.  If she eats certain foods she will have symptoms but if she takes her medications for the most part things are controlled.  She has been using dicyclomine as well as over-the-counter Nexium.  The patient within the last few months ate some very buttery/spicy foods and developed significant left upper quadrant abdominal pain.  When she eats she has the discomfort.  She describes early satiety and an upset/queasy stomach.  She feels that when she is hungry she has worsened pain.  Eating slightly improves the discomfort.  She moves her bowel movements 1-2 times per day.  She describes a previous history of  constipation which led to anal fissures and subsequent led to a potential sphincterotomy but has done well since then.  Patient takes dicyclomine for left lower quadrant abdominal discomfort on an as-needed basis and that has been helpful as she is describing this for her prior issues of potential IBS.  The patient denies having any recent endoscopy since 2005 and had a colonoscopy in 2020.  She does not take significant nonsteroidals or BC/Goody powders.  She feels that her stress levels are stable at this time.  GI Review of Systems Positive as above Negative for dysphagia, odynophagia, nausea, vomiting, change in bowel habits, melena, hematochezia  Review of Systems General: Denies fevers/chills/weight loss unintentionally HEENT: Denies oral lesions Cardiovascular: Denies chest pain/palpitations Pulmonary: Denies shortness of breath/nocturnal cough Gastroenterological: See HPI Genitourinary: Denies darkened urine Hematological: Denies easy bruising/bleeding Endocrine: Denies temperature intolerance Dermatological: Denies jaundice Psychological: Mood is stable though she is hopeful that we can make a difference in her symptoms   Medications Current Outpatient Medications  Medication Sig Dispense Refill  . acetaminophen (TYLENOL) 325 MG tablet Take 650 mg by mouth every 6 (six) hours as needed for moderate pain or headache.    . Cetirizine HCl (ZYRTEC PO) Take 1 tablet by mouth daily.    . Cholecalciferol (VITAMIN D) 2000 UNITS CAPS Take by mouth.    . desonide (DESOWEN) 0.05 % cream as directed.    . dicyclomine (BENTYL) 10 MG capsule Take 10 mg by mouth as needed for spasms.     Marland Kitchen esomeprazole (NEXIUM) 40 MG capsule Take 40 mg by mouth  daily.    . Multiple Vitamin (MULTIVITAMIN) tablet Take 1 tablet by mouth daily.    . vitamin C (ASCORBIC ACID) 250 MG tablet Take 250 mg by mouth daily.     No current facility-administered medications for this visit.    Allergies Allergies   Allergen Reactions  . Amoxicillin-Pot Clavulanate     Other reaction(s): GI Upset (intolerance)  . Aspirin Other (See Comments)    GI upset    Histories Past Medical History:  Diagnosis Date  . Constipation   . Female circumcision   . Headache(784.0)    migraines last one 09/28/11  . History of Helicobacter pylori infection   . Hyperlipemia   . Hypertension   . Incarcerated ventral hernia 08/28/2011   Repaired with mesh, 10/09/11   . PONV (postoperative nausea and vomiting)    pt denies   Past Surgical History:  Procedure Laterality Date  . ANAL FISSURE REPAIR    . CESAREAN SECTION  2002/2005   X 2  . TONSILLECTOMY AND ADENOIDECTOMY  1987  . VENTRAL HERNIA REPAIR  10/09/2011   Procedure: HERNIA REPAIR VENTRAL ADULT;  Surgeon: Haywood Lasso, MD;  Location: [facility];  Service: General;  Laterality: N/A;  Repair of chronic incarcerated ventral hernia and partial omentectomy with mesh.   Social History   Socioeconomic History  . Marital status: Married    Spouse name: Not on file  . Number of children: 2  . Years of education: Not on file  . Highest education level: Not on file  Occupational History  . Occupation: Ship broker  Tobacco Use  . Smoking status: Never Smoker  . Smokeless tobacco: Never Used  Vaping Use  . Vaping Use: Never used  Substance and Sexual Activity  . Alcohol use: No    Alcohol/week: 0.0 standard drinks  . Drug use: No  . Sexual activity: Yes    Birth control/protection: None  Other Topics Concern  . Not on file  Social History Narrative   From [place]          Social Determinants of Health   Financial Resource Strain: Not on file  Food Insecurity: Not on file  Transportation Needs: Not on file  Physical Activity: Not on file  Stress: Not on file  Social Connections: Not on file  Intimate Partner Violence: Not on file   Family History  Problem Relation Age of Onset  . Hypertension Mother   . Anesthesia problems Neg Hx   . Colon  cancer Neg Hx   . Esophageal cancer Neg Hx   . Inflammatory bowel disease Neg Hx   . Liver disease Neg Hx   . Pancreatic cancer Neg Hx   . Rectal cancer Neg Hx   . Stomach cancer Neg Hx    I have reviewed her medical, social, and family history in detail and updated the electronic medical record as necessary.    PHYSICAL EXAMINATION  BP (!) 146/82   Pulse 76   Ht 5' 7"  (1.702 m)   Wt 168 lb 6.4 oz (76.4 kg)   BMI 26.38 kg/m  Wt Readings from Last 3 Encounters:  08/02/20 168 lb 6.4 oz (76.4 kg)  04/06/20 165 lb (74.8 kg)  07/28/19 159 lb (72.1 kg)  GEN: NAD, appears stated age, doesn't appear chronically ill PSYCH: Cooperative, without pressured speech EYE: Conjunctivae pink, sclerae anicteric ENT: Masked CV: RR without R/Gs  RESP: CTAB posteriorly, without wheezing GI: NABS, soft, tenderness to palpation noted in left upper quadrant  region upon deep palpation as well as right upper quadrant (patient states she has not had right upper quadrant discomfort previously), nondistended, surgical scars present, hepatosplenomegaly not appreciated  MSK/EXT: No lower extremity edema SKIN: No jaundice NEURO:  Alert & Oriented x 3, no focal deficits   REVIEW OF DATA  I reviewed the following data at the time of this encounter:  GI Procedures and Studies  November 2020 colonoscopy A normal-appearing cecum, ileocecal valve, and appendiceal orifice were identified.  The ascending colon, transverse, descending, sigmoid, rectum appeared unremarkable.  Retroflexed views of the rectum revealed small internal hemorrhoids.  The scope was withdrawn.  Return in 5 years for colonoscopy due to personal history of colon polyps was recommendation  2005 endoscopy Normal proximal esophagus to duodenal second portion.  No tumors, ulcers, Barrett's, inflammation, varices, AVMs, strictures noted.  Dilation performed with [facility] dilator with no resistance and no heme.  Laboratory Studies  Reviewed those  in epic  Imaging Studies  2013 CT abdomen pelvis with contrast IMPRESSION:  1. No explanation for the patient's pain is seen.  2. The appendix and terminal ileum appear normal.     ASSESSMENT  Ms. Sherard is a 55 y.o. female with a pmh significant for hypertension, hyperlipidemia, ventral hernia (status post repair), GERD, reported prior H. pylori infection, previous constipation, previous anal fissure (status post ?sphincterotomy).  The patient is seen today for evaluation and management of:  1. LUQ pain   2. Dyspepsia   3. Gastroesophageal reflux disease, unspecified whether esophagitis present   4. LLQ pain   5. Abdominal cramping   6. History of colonic polyps    The patient is hemodynamically stable.  Clinically, the patient has been experiencing persistent issues and pain.  The discomfort is dyspeptic in nature but not completely.  There is a reported history of prior H. pylori.  Based on the patient's persistence of symptoms over the course of the last few months, diagnostic endoscopy is recommended.  Imaging for evaluation of her abdomen via abdominal ultrasound is also recommended.  It is not clear that her pain is true biliary colic but we should rule out that her gallbladder is not causing issues at this time.  Cross-sectional imaging may be required if diagnostic laboratory and endoscopic work-up is unremarkable.  The risks and benefits of endoscopic evaluation were discussed with the patient; these include but are not limited to the risk of perforation, infection, bleeding, missed lesions, lack of diagnosis, severe illness requiring hospitalization, as well as anesthesia and sedation related illnesses.  The patient is agreeable to proceed.  Her left lower quadrant abdominal discomfort has been well treated with dicyclomine for years and that will be continued.  We will continue her GERD treatment with Nexium 40 mg daily for now.  All patient questions were answered to the best of my  ability, and the patient agrees to the aforementioned plan of action with follow-up as indicated.   PLAN  Laboratories as outlined below Abdominal ultrasound to be scheduled Diagnostic endoscopy with esophageal/gastric/duodenal biopsies to be obtained Surveillance colonoscopy 08/18/2023 (based on 5-year recommendation by prior endoscopist) Consider CT abdomen/pelvis if work-up unremarkable Continue dicyclomine use Continue Nexium 40 mg daily Obtain records from Dr. [facility]   Orders Placed This Encounter  Procedures  . US Abdomen Complete  . CBC  . Comp Met (CMET)  . Amylase  . Lipase  . TSH  . C-reactive protein  . Tissue transglutaminase, IgA  . IgA  . Ambulatory  referral to Gastroenterology    New Prescriptions   No medications on file   Modified Medications   No medications on file    Planned Follow Up No follow-ups on file.   Total Time in Face-to-Face and in Coordination of Care for patient including independent/personal interpretation/review of prior testing, medical history, examination, medication adjustment, communicating results with the patient directly, and documentation with the EHR is 45 minutes.   Justice Britain, MD [facility] Office # [phone]

## 2020-08-08 ENCOUNTER — Ambulatory Visit (HOSPITAL_COMMUNITY)
Admission: RE | Admit: 2020-08-08 | Discharge: 2020-08-08 | Disposition: A | Discharge status: Home / Self Care | Payer: 59 | Source: Ambulatory Visit | Attending: Gastroenterology | Admitting: Gastroenterology

## 2020-08-08 ENCOUNTER — Other Ambulatory Visit: Payer: Self-pay

## 2020-08-08 DIAGNOSIS — R1032 Left lower quadrant pain: Secondary | ICD-10-CM | POA: Insufficient documentation

## 2020-08-08 DIAGNOSIS — K219 Gastro-esophageal reflux disease without esophagitis: Secondary | ICD-10-CM | POA: Diagnosis present

## 2020-08-08 DIAGNOSIS — R1013 Epigastric pain: Secondary | ICD-10-CM | POA: Insufficient documentation

## 2020-08-08 DIAGNOSIS — R1012 Left upper quadrant pain: Secondary | ICD-10-CM | POA: Insufficient documentation

## 2020-08-18 ENCOUNTER — Ambulatory Visit (AMBULATORY_SURGERY_CENTER): Payer: 59 | Admitting: Gastroenterology

## 2020-08-18 ENCOUNTER — Encounter: Payer: Self-pay | Admitting: Gastroenterology

## 2020-08-18 ENCOUNTER — Other Ambulatory Visit: Payer: Self-pay

## 2020-08-18 VITALS — BP 164/96 | HR 73 | Temp 97.8°F | Resp 19 | Ht 67.0 in | Wt 168.0 lb

## 2020-08-18 DIAGNOSIS — R1013 Epigastric pain: Secondary | ICD-10-CM

## 2020-08-18 DIAGNOSIS — K219 Gastro-esophageal reflux disease without esophagitis: Secondary | ICD-10-CM

## 2020-08-18 DIAGNOSIS — R1012 Left upper quadrant pain: Secondary | ICD-10-CM

## 2020-08-18 DIAGNOSIS — K2950 Unspecified chronic gastritis without bleeding: Secondary | ICD-10-CM | POA: Diagnosis not present

## 2020-08-18 DIAGNOSIS — K2970 Gastritis, unspecified, without bleeding: Secondary | ICD-10-CM | POA: Diagnosis not present

## 2020-08-18 MED ORDER — SODIUM CHLORIDE 0.9 % IV SOLN: 500.0000 mL | Freq: Once | INTRAVENOUS | Status: DC

## 2020-08-18 NOTE — Progress Notes (Signed)
To Pacu, VSS. Report to [email or web address] 

## 2020-08-18 NOTE — Op Note (Signed)
[facility] Patient Name: Hannah Alvarez Procedure Date: 08/18/2020 1:54 PM MRN: [id number] Endoscopist: Justice Britain , MD Age: 55 Referring MD:  Date of Birth: [birth year] Gender: Female Account #: [id number] Procedure:                Upper GI endoscopy Indications:              Abdominal pain in the left upper quadrant,                            Dyspepsia, Esophageal reflux symptoms that persist                            despite appropriate therapy, Abdominal bloating Medicines:                Monitored Anesthesia Care Procedure:                Pre-Anesthesia Assessment:                           - Prior to the procedure, a History and Physical                            was performed, and patient medications and                            allergies were reviewed. The patient's tolerance of                            previous anesthesia was also reviewed. The risks                            and benefits of the procedure and the sedation                            options and risks were discussed with the patient.                            All questions were answered, and informed consent                            was obtained. Prior Anticoagulants: The patient has                            taken no previous anticoagulant or antiplatelet                            agents. ASA Grade Assessment: II - A patient with                            mild systemic disease. After reviewing the risks                            and benefits, the patient was deemed in  satisfactory condition to undergo the procedure.                           After obtaining informed consent, the endoscope was                            passed under direct vision. Throughout the                            procedure, the patient's blood pressure, pulse, and                            oxygen saturations were monitored continuously. The                            Endoscope  was introduced through the mouth, and                            advanced to the second part of duodenum. The upper                            GI endoscopy was accomplished without difficulty.                            The patient tolerated the procedure. Scope In: Scope Out: Findings:                 No gross lesions were noted in the entire                            esophagus. Biopsies were taken with a cold forceps                            for histology to rule out EoE/LoE.                           The Z-line was regular and was found 37 cm from the                            incisors.                           A 2 cm hiatal hernia was present.                           Patchy mildly erythematous mucosa without bleeding                            was found in the gastric body and in the gastric                            antrum.                           No other gross lesions were noted in  the entire                            examined stomach. Biopsies were taken with a cold                            forceps for histology and Helicobacter pylori                            testing.                           No gross lesions were noted in the duodenal bulb,                            in the first portion of the duodenum and in the                            second portion of the duodenum. Biopsies for                            histology were taken with a cold forceps for                            evaluation of celiac disease. Complications:            No immediate complications. Estimated Blood Loss:     Estimated blood loss was minimal. Impression:               - No gross lesions in esophagus. Biopsied.                           - Z-line regular, 37 cm from the incisors.                           - 2 cm hiatal hernia.                           - Erythematous mucosa in the gastric body and                            antrum. No other gross lesions in the stomach.                             Biopsied.                           - No gross lesions in the duodenal bulb, in the                            first portion of the duodenum and in the second                            portion of the duodenum. Biopsied. Recommendation:           - The patient  will be observed post-procedure,                            until all discharge criteria are met.                           - Discharge patient to home.                           - Patient has a contact number available for                            emergencies. The signs and symptoms of potential                            delayed complications were discussed with the                            patient. Return to normal activities tomorrow.                            Written discharge instructions were provided to the                            patient.                           - Resume previous diet.                           - Await pathology results.                           - Continue present medications. May consider                            Carafate and/or FDGard in future depending on                            symptoms.                           - Return to GI clinic in 4-6 weeks for follow up.                           - The findings and recommendations were discussed                            with the patient.                           - The findings and recommendations were discussed                            with the patient's family. Justice Britain, MD 08/18/2020 2:24:55 PM

## 2020-08-18 NOTE — Patient Instructions (Signed)
Information on gastritis and hiatal hernias given to you today.  Await pathology results.  Continue present diet and medications.  Return to GI clinic in 4-6 weeks for follow up.  YOU HAD AN ENDOSCOPIC PROCEDURE TODAY AT [facility]:   Refer to the procedure report that was given to you for any specific questions about what was found during the examination.  If the procedure report does not answer your questions, please call your gastroenterologist to clarify.  If you requested that your care partner not be given the details of your procedure findings, then the procedure report has been included in a sealed envelope for you to review at your convenience later.  YOU SHOULD EXPECT: Some feelings of bloating in the abdomen. Passage of more gas than usual.  Walking can help get rid of the air that was put into your GI tract during the procedure and reduce the bloating. If you had a lower endoscopy (such as a colonoscopy or flexible sigmoidoscopy) you may notice spotting of blood in your stool or on the toilet paper. If you underwent a bowel prep for your procedure, you may not have a normal bowel movement for a few days.  Please Note:  You might notice some irritation and congestion in your nose or some drainage.  This is from the oxygen used during your procedure.  There is no need for concern and it should clear up in a day or so.  SYMPTOMS TO REPORT IMMEDIATELY:   Following upper endoscopy (EGD)  Vomiting of blood or coffee ground material  New chest pain or pain under the shoulder blades  Painful or persistently difficult swallowing  New shortness of breath  Fever of 100F or higher  Black, tarry-looking stools  For urgent or emergent issues, a gastroenterologist can be reached at any hour by calling [phone]. Do not use MyChart messaging for urgent concerns.    DIET:  We do recommend a small meal at first, but then you may proceed to your regular diet.  Drink  plenty of fluids but you should avoid alcoholic beverages for 24 hours.  ACTIVITY:  You should plan to take it easy for the rest of today and you should NOT DRIVE or use heavy machinery until tomorrow (because of the sedation medicines used during the test).    FOLLOW UP: Our staff will call the number listed on your records 48-72 hours following your procedure to check on you and address any questions or concerns that you may have regarding the information given to you following your procedure. If we do not reach you, we will leave a message.  We will attempt to reach you two times.  During this call, we will ask if you have developed any symptoms of COVID 19. If you develop any symptoms (ie: fever, flu-like symptoms, shortness of breath, cough etc.) before then, please call [phone].  If you test positive for Covid 19 in the 2 weeks post procedure, please call and report this information to [place].    If any biopsies were taken you will be contacted by phone or by letter within the next 1-3 weeks.  Please call us at [phone] if you have not heard about the biopsies in 3 weeks.    SIGNATURES/CONFIDENTIALITY: You and/or your care partner have signed paperwork which will be entered into your electronic medical record.  These signatures attest to the fact that that the information above on your After Visit Summary has been reviewed and  is understood.  Full responsibility of the confidentiality of this discharge information lies with you and/or your care-partner.

## 2020-08-18 NOTE — Progress Notes (Signed)
Called to room to assist during endoscopic procedure.  Patient ID and intended procedure confirmed with present staff. Received instructions for my participation in the procedure from the performing physician.  

## 2020-08-18 NOTE — Progress Notes (Signed)
Medical history reviewed with no changes noted. VS assessed by C.W 

## 2020-08-22 ENCOUNTER — Telehealth: Payer: Self-pay | Admitting: *Deleted

## 2020-08-22 NOTE — Telephone Encounter (Signed)
Follow up call made. 

## 2020-08-22 NOTE — Telephone Encounter (Signed)
  Follow up Call-  Call back number 08/18/2020  Post procedure Call Back phone  # [phone]  Permission to leave phone message Yes  Some recent data might be hidden     Patient questions:  Do you have a fever, pain , or abdominal swelling? No. Pain Score  0 *  Have you tolerated food without any problems? Yes.    Have you been able to return to your normal activities? Yes.    Do you have any questions about your discharge instructions: Diet   No. Medications  No. Follow up visit  No.  Do you have questions or concerns about your Care? No.  Actions: * If pain score is 4 or above: No action needed, pain <4.  1. Have you developed a fever since your procedure? no  2.   Have you had an respiratory symptoms (SOB or cough) since your procedure? no  3.   Have you tested positive for COVID 19 since your procedure no  4.   Have you had any family members/close contacts diagnosed with the COVID 19 since your procedure?  no   If yes to any of these questions please route to Joylene John, RN and Joella Prince, RN

## 2020-09-01 ENCOUNTER — Encounter: Payer: Self-pay | Admitting: Gastroenterology

## 2021-01-31 ENCOUNTER — Emergency Department (HOSPITAL_COMMUNITY)
Admission: EM | Admit: 2021-01-31 | Discharge: 2021-02-01 | Disposition: A | Discharge status: Home / Self Care | Payer: 59 | Attending: Emergency Medicine | Admitting: Emergency Medicine

## 2021-01-31 ENCOUNTER — Emergency Department (HOSPITAL_COMMUNITY): Payer: 59

## 2021-01-31 ENCOUNTER — Other Ambulatory Visit: Payer: Self-pay

## 2021-01-31 DIAGNOSIS — I10 Essential (primary) hypertension: Secondary | ICD-10-CM | POA: Insufficient documentation

## 2021-01-31 DIAGNOSIS — R200 Anesthesia of skin: Secondary | ICD-10-CM | POA: Diagnosis not present

## 2021-01-31 DIAGNOSIS — R519 Headache, unspecified: Secondary | ICD-10-CM | POA: Diagnosis present

## 2021-01-31 LAB — COMPREHENSIVE METABOLIC PANEL
ALT: 24 U/L (ref 0–44)
AST: 26 U/L (ref 15–41)
Albumin: 4.6 g/dL (ref 3.5–5.0)
Alkaline Phosphatase: 90 U/L (ref 38–126)
Anion gap: 10 (ref 5–15)
BUN: 13 mg/dL (ref 6–20)
CO2: 28 mmol/L (ref 22–32)
Calcium: 9.6 mg/dL (ref 8.9–10.3)
Chloride: 102 mmol/L (ref 98–111)
Creatinine, Ser: 0.52 mg/dL (ref 0.44–1.00)
GFR, Estimated: >60 mL/min (ref >60)
Glucose, Bld: 107 mg/dL — ABNORMAL HIGH (ref 70–99)
Potassium: 4.6 mmol/L (ref 3.5–5.1)
Sodium: 140 mmol/L (ref 135–145)
Total Bilirubin: 0.5 mg/dL (ref 0.3–1.2)
Total Protein: 8 g/dL (ref 6.5–8.1)

## 2021-01-31 LAB — CBC WITH DIFFERENTIAL/PLATELET
Abs Immature Granulocytes: 0.02 10*3/uL (ref 0.00–0.07)
Basophils Absolute: 0 10*3/uL (ref 0.0–0.1)
Basophils Relative: 1 %
Eosinophils Absolute: 0 10*3/uL (ref 0.0–0.5)
Eosinophils Relative: 1 %
HCT: 41.4 % (ref 36.0–46.0)
Hemoglobin: 13.3 g/dL (ref 12.0–15.0)
Immature Granulocytes: 0 %
Lymphocytes Relative: 23 %
Lymphs Abs: 1.2 10*3/uL (ref 0.7–4.0)
MCH: 28.1 pg (ref 26.0–34.0)
MCHC: 32.1 g/dL (ref 30.0–36.0)
MCV: 87.3 fL (ref 80.0–100.0)
Monocytes Absolute: 0.3 10*3/uL (ref 0.1–1.0)
Monocytes Relative: 5 %
Neutro Abs: 3.6 10*3/uL (ref 1.7–7.7)
Neutrophils Relative %: 70 %
Platelets: 342 10*3/uL (ref 150–400)
RBC: 4.74 MIL/uL (ref 3.87–5.11)
RDW: 14.5 % (ref 11.5–15.5)
WBC: 5.1 10*3/uL (ref 4.0–10.5)
nRBC: 0 % (ref 0.0–0.2)

## 2021-01-31 NOTE — ED Triage Notes (Signed)
Pt states she has had right eye twitching for 2 days and also intermittent numbness to right cheek.  Also endorses pain down right arm.  Was seen by a nurse today and states BP was 160s/110 and was told to come to the ED. Pt has NIH of zero in triage and does not endorse any numbness on the right side of her face now.  Pt states eye is no longer "twitching"

## 2021-01-31 NOTE — ED Provider Notes (Signed)
Emergency Medicine Provider Triage Evaluation Note  Hannah Alvarez , a 55 y.o. female  was evaluated in triage.  Pt complains of pain in the right arm and leg.  She states that she has pain on the right side of her head and face with numbness on the right side of the face also.  She states that the pain goes from her head on the right side all the way down to her right foot.  She states that she has abnormal sensation intermittently in her right foot that feels like it is cold or wet when it is not.  She denies any trauma.   Review of Systems  Positive: Headache, hypertension, right sided body pain Negative: Syncope.   Physical Exam  BP (!) 176/94 (BP Location: Right Arm)   Pulse 77   Temp 98 F (36.7 C) (Oral)   Resp 18   Ht 5\' 7"  (1.702 m)   Wt 72.6 kg   SpO2 100%   BMI 25.06 kg/m  Gen:   Awake, no distress   Resp:  Normal effort  MSK:   Moves extremities without difficulty  Other:  2+ dp/PT pulses and radial pulses bilaterally.   Medical Decision Making  Medically screening exam initiated at 8:45 PM.  Appropriate orders placed.  Hannah Alvarez was informed that the remainder of the evaluation will be completed by another provider, this initial triage assessment does not replace that evaluation, and the importance of remaining in the ED until their evaluation is complete.  Note: Portions of this report may have been transcribed using voice recognition software. Every effort was made to ensure accuracy; however, inadvertent computerized transcription errors may be present    Ollen Gross 01/31/21 2053    Lennice Sites, DO 01/31/21 2140

## 2021-01-31 NOTE — ED Provider Notes (Signed)
[facility] DEPT Provider Note   CSN: [id number] Arrival date & time: 01/31/21  3818     History Chief Complaint  Patient presents with   Hypertension    Hannah Alvarez is a 55 y.o. female.  Patient presents to the ED with a chief complaint of right sided headache.  She states that the symptoms started about 2 days ago.  She also reports having had an episode of right sided facial numbness, which was present 2 days ago, but has resolved now.  States that she had a home health nurse tell her that her BP was high and that she needed to go to the ER for evaluation.  She reports having intermittent abnormal sensation in her right foot.  She denies any chest pain, SOB, or syncope.  Denies any treatment PTA.  The history is provided by the patient. No language interpreter was used.      Past Medical History:  Diagnosis Date   Constipation    Female circumcision    Headache(784.0)    migraines last one 2/99/37   History of Helicobacter pylori infection    Hyperlipemia    Hypertension    Incarcerated ventral hernia 08/28/2011   Repaired with mesh, 10/09/11    PONV (postoperative nausea and vomiting)    pt denies    Patient Active Problem List   Diagnosis Date Noted   LUQ pain 08/05/2020   Dyspepsia 08/05/2020   LLQ pain 08/05/2020   Abdominal cramping 08/05/2020   History of colonic polyps 08/05/2020   Pain in right leg 04/06/2020   Pain in right shoulder 07/07/2019   Cervicalgia 07/07/2019   Essential hypertension 05/31/2014   Hypercholesteremia 05/31/2014   Elevated prolactin level 03/18/2013   HYDRONEPHROSIS 01/26/2008   ABDOMINAL PAIN, LEFT UPPER QUADRANT 12/22/2007   ESOPHAGEAL STRICTURE 07/29/2007   GERD 07/29/2007    Past Surgical History:  Procedure Laterality Date   ANAL FISSURE REPAIR     CESAREAN SECTION  2002/2005   X 2   TONSILLECTOMY AND ADENOIDECTOMY  1987   VENTRAL HERNIA REPAIR  10/09/2011   Procedure: HERNIA REPAIR  VENTRAL ADULT;  Surgeon: Haywood Lasso, MD;  Location: [facility];  Service: General;  Laterality: N/A;  Repair of chronic incarcerated ventral hernia and partial omentectomy with mesh.     OB History     Gravida  5   Para  2   Term  2   Preterm      AB  3   Living  2      SAB  3   IAB      Ectopic      Multiple      Live Births              Family History  Problem Relation Age of Onset   Hypertension Mother    Anesthesia problems Neg Hx    Colon cancer Neg Hx    Esophageal cancer Neg Hx    Inflammatory bowel disease Neg Hx    Liver disease Neg Hx    Pancreatic cancer Neg Hx    Rectal cancer Neg Hx    Stomach cancer Neg Hx     Social History   Tobacco Use   Smoking status: Never   Smokeless tobacco: Never  Vaping Use   Vaping Use: Never used  Substance Use Topics   Alcohol use: No    Alcohol/week: 0.0 standard drinks   Drug use: No  Home Medications Prior to Admission medications   Medication Sig Start Date End Date Taking? Authorizing Provider  acetaminophen (TYLENOL) 325 MG tablet Take 650 mg by mouth every 6 (six) hours as needed for moderate pain or headache. Patient not taking: Reported on 08/18/2020    [provider]  Cetirizine HCl (ZYRTEC PO) Take 1 tablet by mouth daily.    [provider]  Cholecalciferol (VITAMIN D) 2000 UNITS CAPS Take by mouth. Patient not taking: Reported on 08/18/2020    [provider]  desonide (DESOWEN) 0.05 % cream as directed. Patient not taking: Reported on 08/18/2020 11/11/18   [provider]  dicyclomine (BENTYL) 10 MG capsule Take 10 mg by mouth as needed for spasms.  Patient not taking: Reported on 08/18/2020    [provider]  esomeprazole (NEXIUM) 40 MG capsule Take 40 mg by mouth daily.    [provider]  Multiple Vitamin (MULTIVITAMIN) tablet Take 1 tablet by mouth daily. Patient not taking: Reported on 08/18/2020    [provider]   vitamin C (ASCORBIC ACID) 250 MG tablet Take 250 mg by mouth daily. Patient not taking: Reported on 08/18/2020    [provider]    Allergies    Amoxicillin-pot clavulanate and Aspirin  Review of Systems   Review of Systems  All other systems reviewed and are negative.  Physical Exam Updated Vital Signs BP (!) 176/94 (BP Location: Right Arm)   Pulse 77   Temp 98 F (36.7 C) (Oral)   Resp 18   Ht 5\' 7"  (1.702 m)   Wt 72.6 kg   SpO2 100%   BMI 25.06 kg/m   Physical Exam Vitals and nursing note reviewed.  Constitutional:      General: She is not in acute distress.    Appearance: She is well-developed.  HENT:     Head: Normocephalic and atraumatic.  Eyes:     Conjunctiva/sclera: Conjunctivae normal.  Cardiovascular:     Rate and Rhythm: Normal rate and regular rhythm.     Heart sounds: No murmur heard. Pulmonary:     Effort: Pulmonary effort is normal. No respiratory distress.     Breath sounds: Normal breath sounds.  Abdominal:     Palpations: Abdomen is soft.     Tenderness: There is no abdominal tenderness.  Musculoskeletal:        General: Normal range of motion.     Cervical back: Neck supple.  Skin:    General: Skin is warm and dry.  Neurological:     Mental Status: She is alert and oriented to person, place, and time.     Comments: CN 3-12 intact, speech is clear, movements are goal oriented  Psychiatric:        Mood and Affect: Mood normal.        Behavior: Behavior normal.    ED Results / Procedures / Treatments   Labs (all labs ordered are listed, but only abnormal results are displayed) Labs Reviewed  COMPREHENSIVE METABOLIC PANEL - Abnormal; Notable for the following components:      Result Value   Glucose, Bld 107 (*)    All other components within normal limits  CBC WITH DIFFERENTIAL/PLATELET    EKG None  Radiology CT Head Wo Contrast  Result Date: 01/31/2021 CLINICAL DATA:  Right eye twitching and intermittent right cheek  numbness. EXAM: CT HEAD WITHOUT CONTRAST TECHNIQUE: Contiguous axial images were obtained from the base of the skull through the vertex without intravenous contrast.  COMPARISON:  None. FINDINGS: Brain: No evidence of acute infarction, hemorrhage, hydrocephalus, extra-axial collection or mass lesion/mass effect. A small area of chronic appearing white matter low attenuation is seen within the frontal parietal region on the right (axial CT image 19, CT series 2). Vascular: No hyperdense vessel or unexpected calcification. Skull: Normal. Negative for fracture or focal lesion. Sinuses/Orbits: No acute finding. Other: None. IMPRESSION: 1. Small area of chronic appearing white matter low attenuation within the frontal parietal region on the right. MRI correlation is recommended. 2. No acute intracranial abnormality. Electronically Signed   By: Virgina Norfolk M.D.   On: 01/31/2021 21:12    Procedures Procedures   Medications Ordered in ED Medications - No data to display  ED Course  I have reviewed the triage vital signs and the nursing notes.  Pertinent labs & imaging results that were available during my care of the patient were reviewed by me and considered in my medical decision making (see chart for details).    MDM Rules/Calculators/A&P                           Patient here with right sided headache, elevated BP, and now resolved right sided facial numbness with some intermittent sensation in her right lower extremity.  MRI correlation is recommended by radiology for evaluation of small area of white matter low attenuation, which could be chronic.  Will monitor BP and get MRI.   MRI shows no acute abnormality.  There are some chronic changes.  I discussed these with the patient and have printed out her results to take and review with her doctor.    Will start on low-dose HCTZ.   Final Clinical Impression(s) / ED Diagnoses Final diagnoses:  Hypertension, unspecified type    Rx / DC  Orders ED Discharge Orders     None        Montine Circle, PA-C 02/01/21 0505    Lennice Sites, DO 02/03/21 1504

## 2021-02-01 MED ORDER — HYDROCHLOROTHIAZIDE 25 MG PO TABS: 25.0000 mg | ORAL_TABLET | Freq: Every day | ORAL | 0 refills | Status: AC

## 2021-02-01 NOTE — ED Notes (Signed)
Pt feels fine and wants to go home

## 2021-02-01 NOTE — Discharge Instructions (Addendum)
Please discuss your results and your blood pressure with your doctor.  I've attached your results to this discharge summary.

## 2021-02-28 ENCOUNTER — Ambulatory Visit (INDEPENDENT_AMBULATORY_CARE_PROVIDER_SITE_OTHER): Payer: 59 | Admitting: Nurse Practitioner

## 2021-02-28 ENCOUNTER — Other Ambulatory Visit: Payer: Self-pay

## 2021-02-28 ENCOUNTER — Encounter: Payer: Self-pay | Admitting: Nurse Practitioner

## 2021-02-28 VITALS — BP 140/84 | Ht 66.0 in | Wt 130.0 lb

## 2021-02-28 DIAGNOSIS — Z01419 Encounter for gynecological examination (general) (routine) without abnormal findings: Secondary | ICD-10-CM | POA: Diagnosis not present

## 2021-02-28 DIAGNOSIS — Z780 Asymptomatic menopausal state: Secondary | ICD-10-CM | POA: Diagnosis not present

## 2021-02-28 NOTE — Progress Notes (Signed)
   Hannah Alvarez 1966-04-19 [id number]   History:  55 y.o. [id number] presents for annual exam. Postmenopausal - no HRT. Normal pap and mammogram history. HTN, GERD managed by PCP. History of elevated prolactin level, followed by [facility].   Gynecologic History LMP 2020. Postmenopausal   Contraception/Family planning: post menopausal status Sexually active: Yes  Health Maintenance Last Pap: 07/28/2019. Results were: Normal, 5-year repeat Last mammogram: 2020. Results were: Normal Last colonoscopy: 2021. Results were: Tubular adenoma, 5-year recall Last Dexa: Not indicated  Past medical history, past surgical history, family history and social history were all reviewed and documented in the EPIC chart. 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   ROS:  A ROS was performed and pertinent positives and negatives are included.  Exam:  Vitals:   02/28/21 1108  BP: 140/84  Weight: 130 lb (59 kg)  Height: 5\' 6"  (1.676 m)   Body mass index is 20.98 kg/m.  General appearance:  Normal Thyroid:  Symmetrical, normal in size, without palpable masses or nodularity. Respiratory  Auscultation:  Clear without wheezing or rhonchi Cardiovascular  Auscultation:  Regular rate, without rubs, murmurs or gallops  Edema/varicosities:  Not grossly evident Abdominal  Soft,nontender, without masses, guarding or rebound.  Liver/spleen:  No organomegaly noted  Hernia:  None appreciated  Skin  Inspection:  Grossly normal Breasts: Examined lying and sitting.   Right: Without masses, retractions, nipple discharge or axillary adenopathy.   Left: Without masses, retractions, nipple discharge or axillary adenopathy. Genitourinary   Inguinal/mons:  Normal without inguinal adenopathy  External genitalia:  Normal appearing vulva with no masses, tenderness, or lesions  BUS/Urethra/Skene's glands:  Normal  Vagina:  Female circumcision  Cervix:  Normal appearing without discharge or lesions  Uterus:  Normal in  size, shape and contour.  Midline and mobile, nontender  Adnexa/parametria:     Rt: Normal in size, without masses or tenderness.   Lt: Normal in size, without masses or tenderness.  Anus and perineum: Normal  Digital rectal exam: Normal sphincter tone without palpated masses or tenderness  Patient informed chaperone available to be present for breast and pelvic exam. Patient has requested no chaperone to be present. Patient has been advised what will be completed during breast and pelvic exam.   Assessment/Plan:  55 y.o. [id number] for annual exam.   Well female exam with routine gynecological exam - Education provided on SBEs, importance of preventative screenings, current guidelines, high calcium diet, regular exercise, and multivitamin daily.  Labs with PCP.   Postmenopausal - no HRT, no bleeding  Screening for cervical cancer - Normal Pap history.  Will repeat at 5-year interval per guidelines.  Screening for breast cancer - Overdue for mammogram. Discussed current guidelines and importance of screenings. She plans to schedule this soon. Normal breast exam today.  Screening for colon cancer - 2021 colonoscopy per patient. Will repeat at GI's recommended interval.   Screening for osteoporosis - average risk. Will plan for DXA at age 55.   Return in 1 year for annual.   Tamela Gammon DNP, 11:42 AM 02/28/2021

## 2021-03-08 ENCOUNTER — Telehealth: Payer: Self-pay

## 2021-03-08 NOTE — Telephone Encounter (Signed)
[facility] endocrinology manages this for her. Looks like it was last checked 05/2019 and they recommended she follow up in 6 months.

## 2021-03-08 NOTE — Telephone Encounter (Signed)
Left message to call.

## 2021-03-08 NOTE — Telephone Encounter (Signed)
Patient saw Marny Lowenstein, NP on 02/28/21.  She called today because she said she needed to have her Prolactin level checked.  She said while under Dr. Valeta Harms care she had hyperprolactinemia and took medication.

## 2021-03-10 NOTE — Telephone Encounter (Signed)
Patient aware of Providers recommendations

## 2021-03-22 ENCOUNTER — Other Ambulatory Visit: Payer: Self-pay | Admitting: Nurse Practitioner

## 2021-03-22 ENCOUNTER — Other Ambulatory Visit: Payer: 59

## 2021-03-22 ENCOUNTER — Other Ambulatory Visit: Payer: Self-pay

## 2021-03-22 DIAGNOSIS — E221 Hyperprolactinemia: Secondary | ICD-10-CM

## 2021-03-23 LAB — PROLACTIN: Prolactin: 14.3 ng/mL

## 2021-03-27 ENCOUNTER — Other Ambulatory Visit: Payer: Self-pay

## 2021-03-28 ENCOUNTER — Other Ambulatory Visit: Payer: Self-pay | Admitting: Nurse Practitioner

## 2021-03-31 ENCOUNTER — Encounter: Payer: Self-pay | Admitting: Nurse Practitioner

## 2021-04-15 IMAGING — US US ABDOMEN COMPLETE
1 series · 14 of 25 positions shown · non-contrast
Comparison: Ultrasound abdomen 08/31/2011

CLINICAL DATA: Left abdominal pain

GERD
EXAM:
ABDOMEN ULTRASOUND COMPLETE

[Series 1: us abdomen complete · 14 of 85 slices shown]
[im 1/85]
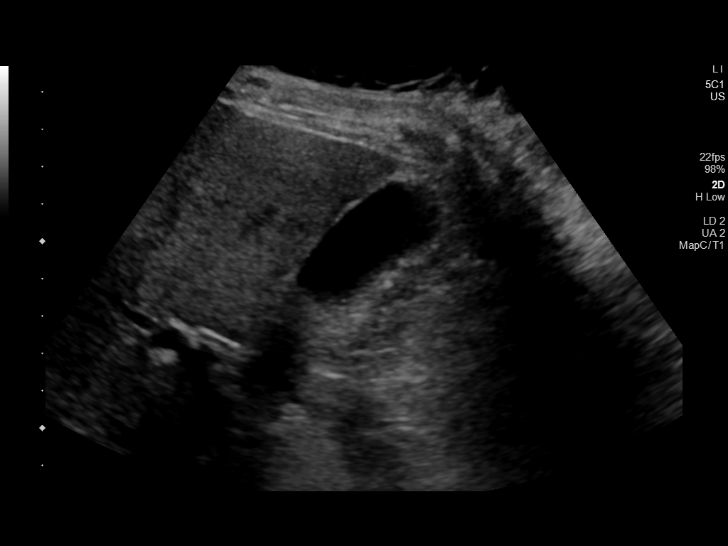
[im 8/85]
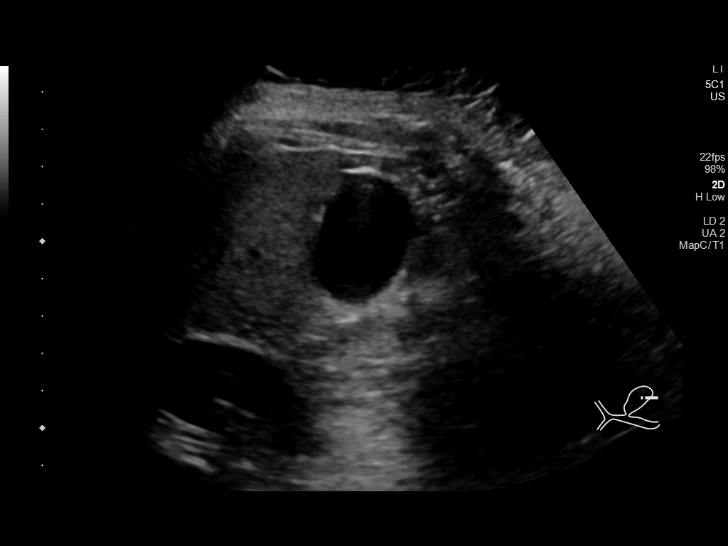
[im 15/85]
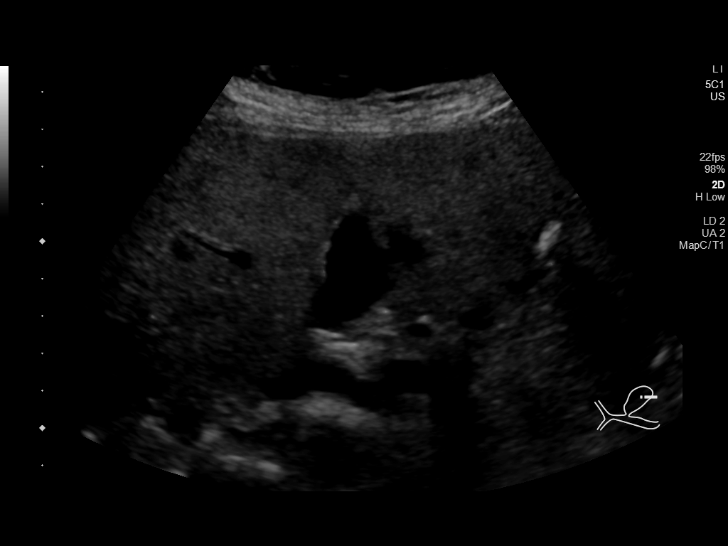
[im 22/85]
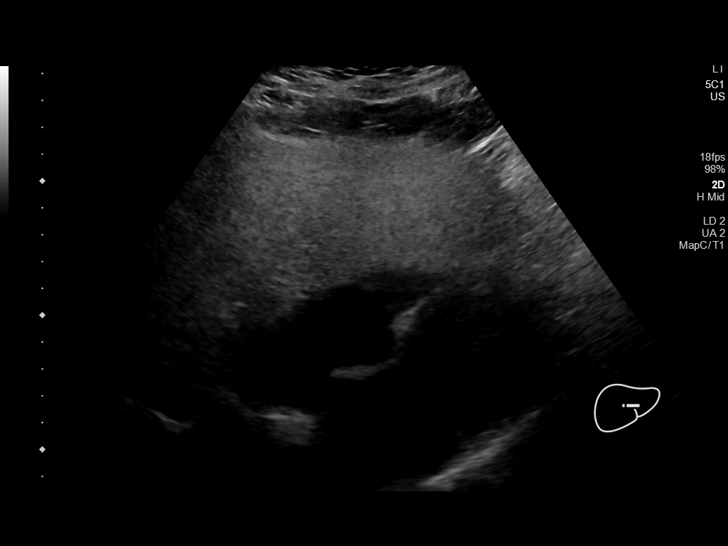
[im 29/85]
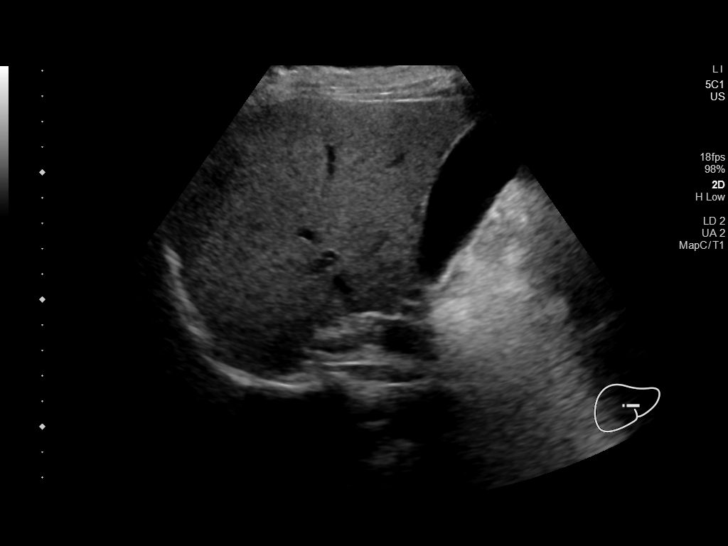
[im 32/85]
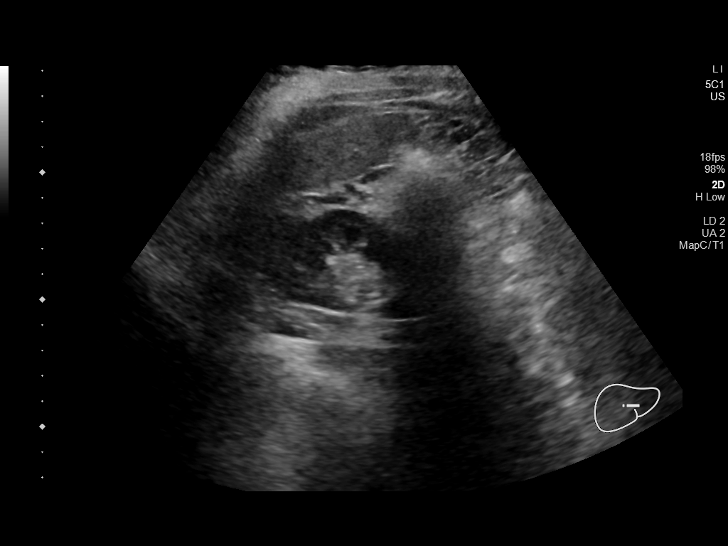
[im 39/85]
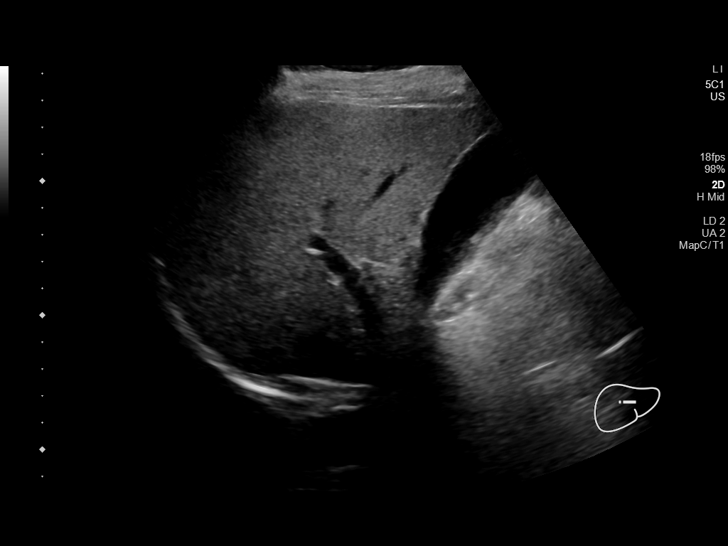
[im 46/85]
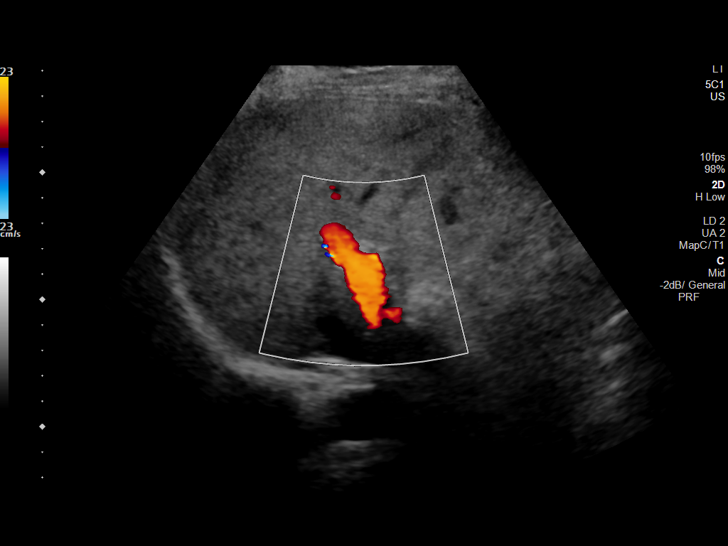
[im 53/85]
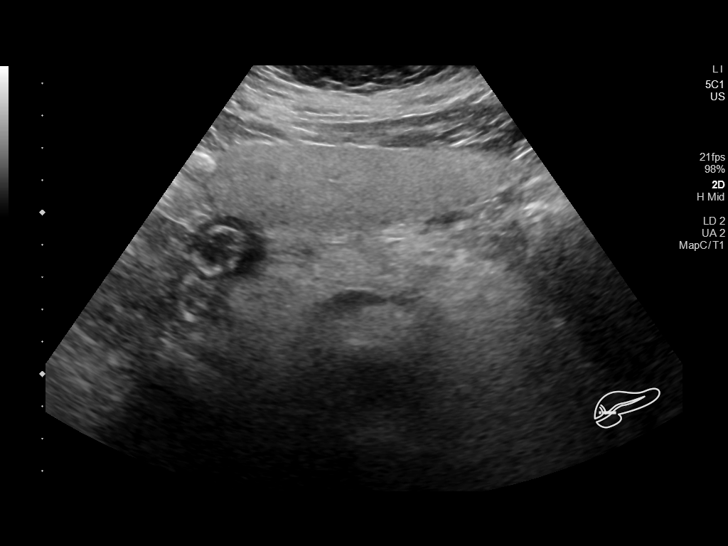
[im 57/85]
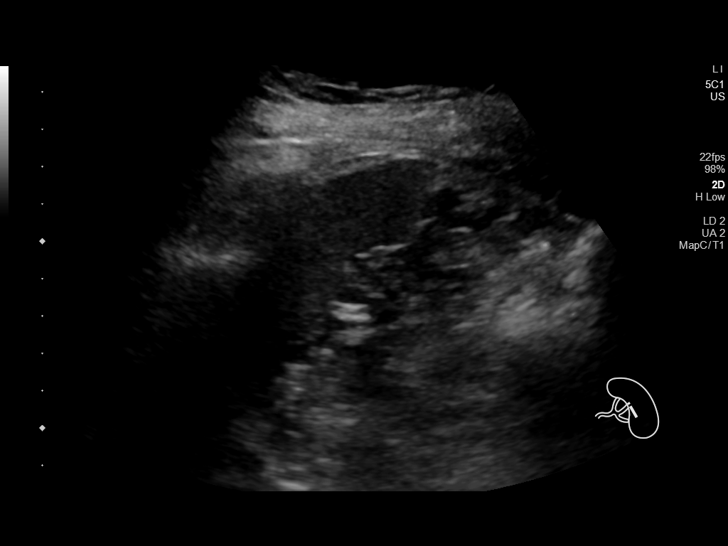
[im 64/85]
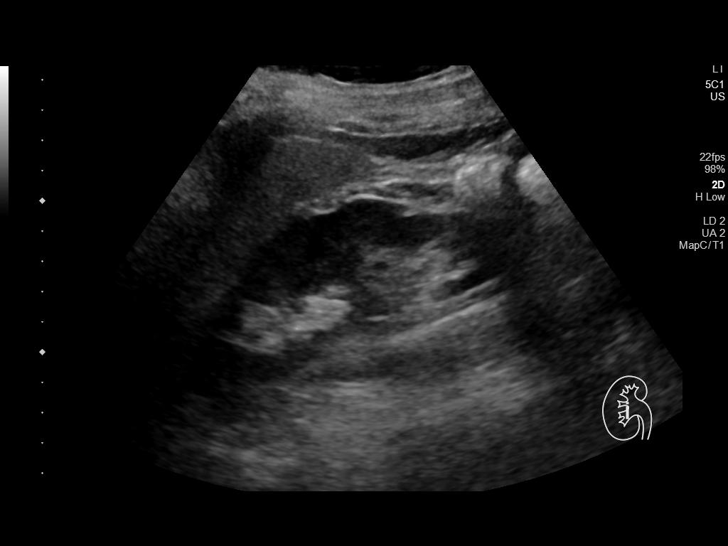
[im 71/85]
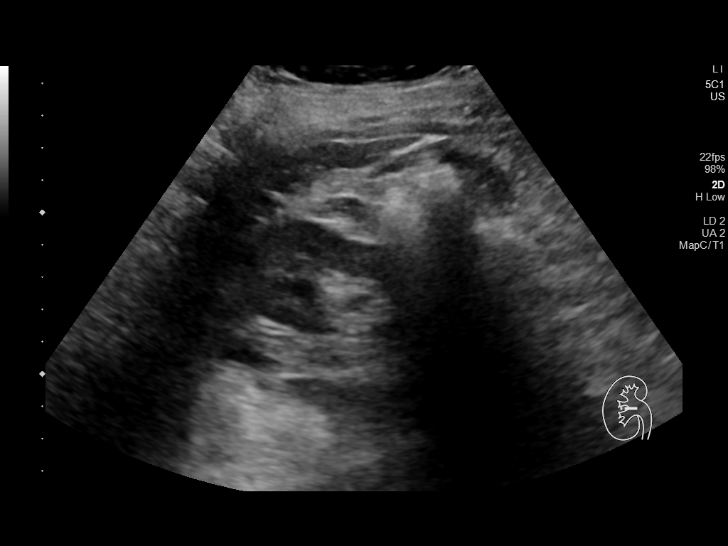
[im 78/85]
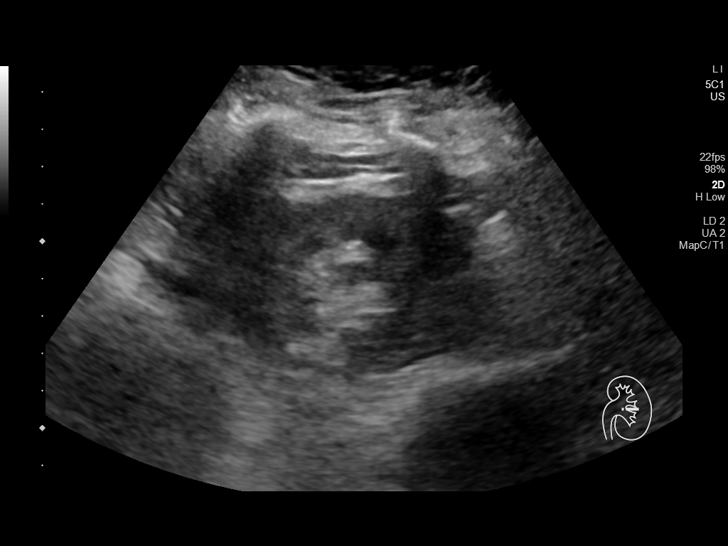
[im 85/85]
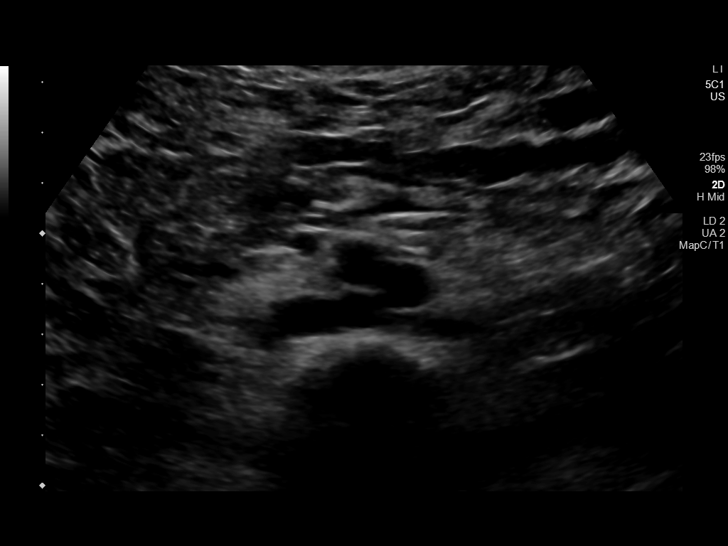

[14 of 25 positions shown; findings below may reference images not displayed]

FINDINGS: Gallbladder: No gallstones or wall thickening visualized. No
sonographic Murphy sign noted by sonographer.

Common bile duct: Diameter: 4 mm

Liver:

No focal lesion.

Diffusely increased parenchymal echogenicity.

Portal vein is patent on color Doppler imaging with normal direction
of blood flow towards the liver.

IVC: No abnormality visualized.

Pancreas: Visualized portion unremarkable.

Spleen: Size and appearance within normal limits.

Right Kidney: Length: 12.0 cm. Echogenicity within normal limits. No
mass or hydronephrosis visualized.

Left Kidney: Length: 10.4 cm. Echogenicity within normal limits. No
mass or hydronephrosis visualized.

Abdominal aorta: No aneurysm visualized.

Other findings: None.
IMPRESSION: Diffuse increased echogenicity of the hepatic parenchyma is a
nonspecific indicator of hepatocellular dysfunction, most commonly
steatosis.

## 2021-06-07 NOTE — Progress Notes (Signed)
06/08/2021 Hannah Alvarez [id number] Jan 14, 1966   ASSESSMENT AND PLAN:   Dyspepsia -     dicyclomine (BENTYL) 10 MG capsule; Take 1 capsule (10 mg total) by mouth as needed for spasms. -Patient with endoscopy 07/2020 showed gastritis, negative H. pylori, negative celiac, negative malignancy.  Showed 2 cm hiatal hernia -Normal abdominal ultrasound -We will try to add on FD guard over-the-counter, if does not help we will try to do Nexium daily for patient's hiatal hernia.  Gastroesophageal reflux disease, unspecified whether esophagitis present - -Patient with endoscopy 07/2020 showed gastritis, negative H. pylori, negative celiac, negative malignancy.  Showed 2 cm hiatal hernia -Normal abdominal ultrasound -We will try to add on FD guard over-the-counter, if does not help we will try to do Nexium daily for patient's hiatal hernia. -We will schedule for follow-up 3 months, if pain continues can consider HIDA scan versus gastric emptying study the patient does not have significant risk factors for these are really fit the criteria.  History of colonic polyps Surveillance colonoscopy 08/18/2023   LUQ pain/chest pain -     dicyclomine (BENTYL) 10 MG capsule; Take 1 capsule (10 mg total) by mouth as needed for spasms. -Refilled dicyclomine for the patient, normal abdominal ultrasound. -Can consider CT abdomen and pelvis, however normal endoscopy, no weight loss, and no significant symptoms. - nonexterional pain, no SOB with exertion, discussed cardiac symptoms in women with patient, will follow up with PCP.   Fatty liver -Discussed pathology with patient, given information about lifestyle, states she gets her labs followed with primary care every 6 months.    Future Appointments  Date Time Provider [place]  03/21/2022  8:30 AM Princess Bruins, MD [facility] None    History of Present Illness:  56 y.o. female presents for evaluation of LUQ pain.  She has medical history  significant for hypertension, hyperlipidemia, ventral hernia status postrepair, reflux, reported prior H. pylori infection, constipation, anal fissure, presents for follow-up of left upper quadrant pain.  Patient was last seen in the office by Dr. Rush Landmark 08/02/2020.  Recount of that visit showed: On dicyclomine as needed for lower abdominal discomfort.  Lab showed  negative for celiac, pancreatic labs normal, electrolytes and liver function tests are normal, elevated random glucose, thyroid function normal mild leukopenia no anemia. Abdominal ultrasound showed diffuse echogenicity most likely steatosis otherwise unremarkable. She is on nexium 40 mg once a day and dicyclomine as needed- these medications help her.  She needs refill on dicyclomine.  She had EGD 08/18/20 for her GERD, showed gastritis negative H pylori, negative celiac.   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  Since that time she has started to have nausea, acid feeling. She has LUQ pain when she is hungry or full, always feels full.  If she is not full/hungry she feels okay.  Occ dysphagia with dry foods/meat, nothing with liquids.  Denies diarrhea, constipation, melena, hematochezia.   She has not been on nexium, on it as needed, when she has pain she will take it every day and then it will improve  Since her last visit patient was seen in the ER for hypertension, MRI was negative started on low-dose HCTZ  External labs and notes reviewed this visit:  08/18/2020 EGD  - No gross lesions in  esophagus. Biopsied. - Z-line regular, 37 cm from the incisors. - 2 cm hiatal hernia. - Erythematous mucosa in the gastric body and antrum. No other gross lesions in the stomach. Biopsied. - No gross lesions in the duodenal bulb, in the first portion of the duodenum and  in the second portion of the duodenum. Biopsied.  AB [place] 08/08/20 Diffuse increased echogenicity of the hepatic parenchyma is a nonspecific indicator of hepatocellular dysfunction, most commonly steatosis.  November 2020 colonoscopy A normal-appearing cecum, ileocecal valve, and appendiceal orifice were identified.  The ascending colon, transverse, descending, sigmoid, rectum appeared unremarkable.  Retroflexed views of the rectum revealed small internal hemorrhoids.  The scope was withdrawn.  Return in 5 years for colonoscopy due to personal history of colon polyps was recommendation   2005 endoscopy Normal proximal esophagus to duodenal second portion.  No tumors, ulcers, Barrett's, inflammation, varices, AVMs, strictures noted.  Dilation performed with [facility] dilator with no resistance and no heme.   CT abdomen pelvis with contrast 01/10/2012 for right lower abdominal pain Unremarkable  No results found.  Current Medications, Allergies, Past Medical History, Past Surgical History, Family History and Social History were reviewed in [employer] record.   Physical Exam: BP 116/74    Pulse 76    Ht 5\' 6"  (1.676 m)    Wt 165 lb (74.8 kg)    BMI 26.63 kg/m  General:   Pleasant, well developed female in no acute distress Eyes: sclerae anicteric,conjunctive pink  Heart:  regular rate and rhythm, no murmurs or gallops Pulm: Clear anteriorly; no wheezing, non tender chest wall Abdomen:  Soft,  nondistended  AB, skin exam normal, Normal bowel sounds. mild tenderness in the upper abdomen. Without guarding and Without rebound, without hepatomegaly. Extremities:  Without edema. Peripheral pulses intact.  Neurologic:  Alert and  oriented x4;  grossly normal neurologically. Skin:   Dry and intact without significant lesions or rashes. Psychiatric: Demonstrates good judgement and reason without abnormal affect or behaviors.  Vladimir Crofts, PA-C 06/08/21

## 2021-06-08 ENCOUNTER — Ambulatory Visit (INDEPENDENT_AMBULATORY_CARE_PROVIDER_SITE_OTHER): Payer: 59 | Admitting: Physician Assistant

## 2021-06-08 ENCOUNTER — Encounter: Payer: Self-pay | Admitting: Physician Assistant

## 2021-06-08 VITALS — BP 116/74 | HR 76 | Ht 66.0 in | Wt 165.0 lb

## 2021-06-08 DIAGNOSIS — K219 Gastro-esophageal reflux disease without esophagitis: Secondary | ICD-10-CM | POA: Diagnosis not present

## 2021-06-08 DIAGNOSIS — Z86010 Personal history of colonic polyps: Secondary | ICD-10-CM | POA: Diagnosis not present

## 2021-06-08 DIAGNOSIS — R1013 Epigastric pain: Secondary | ICD-10-CM

## 2021-06-08 DIAGNOSIS — K760 Fatty (change of) liver, not elsewhere classified: Secondary | ICD-10-CM

## 2021-06-08 DIAGNOSIS — R1012 Left upper quadrant pain: Secondary | ICD-10-CM | POA: Diagnosis not present

## 2021-06-08 MED ORDER — DICYCLOMINE HCL 10 MG PO CAPS: 10.0000 mg | ORAL_CAPSULE | ORAL | 2 refills | Status: DC | PRN

## 2021-06-08 NOTE — Patient Instructions (Addendum)
If you are age 58 or younger, your body mass index should be between 19-25. Your Body mass index is 26.63 kg/m. If this is out of the aformentioned range listed, please consider follow up with your Primary Care Provider.  ________________________________________________________  The [facility] providers would like to encourage you to use [facility] to communicate with providers for non-urgent requests or questions.  Due to long hold times on the telephone, sending your provider a message by [facility] may be a faster and more efficient way to get a response.  Please allow 48 business hours for a response.  Please remember that this is for non-urgent requests.  _______________________________________________________  We have sent refills of your Dicyclomine to your pharmacy.  Please contact the office in a few weeks to schedule a 3 month follow up with Dr. Rush Landmark.  Thank you for entrusting me with your care and choosing [facility].  Vicie Mutters, PA-C  Can try FDGard every day for 2-4 weeks, can find this at the store, if this is not better after that time please take the nexium every day and see if this helps.  Please take your proton pump inhibitor medication 30 minutes to 1 hour before meals- this makes it more effective.   Avoid spicy and acidic foods Avoid fatty foods Limit your intake of coffee, tea, alcohol, and carbonated drinks Work to maintain a healthy weight Keep the head of the bed elevated at least 3 inches with blocks or a wedge pillow if you are having any nighttime symptoms Stay upright for 2 hours after eating Avoid meals and snacks three to four hours before bedtime    Hiatal Hernia A hiatal hernia occurs when part of the stomach slides above the muscle that separates the abdomen from the chest (diaphragm). A person can be born with a hiatal hernia (congenital), or it may develop over time. In almost all cases of hiatal hernia, only the top part of the stomach  pushes through the diaphragm. Many people have a hiatal hernia with no symptoms. The larger the hernia, the more likely it is that you will have symptoms. In some cases, a hiatal hernia allows stomach acid to flow back into the tube that carries food from your mouth to your stomach (esophagus). This may cause heartburn symptoms. Severe heartburn symptoms may mean that you have developed a condition called gastroesophageal reflux disease (GERD). What are the causes? This condition is caused by a weakness in the opening (hiatus) where the esophagus passes through the diaphragm to attach to the upper part of the stomach. A person may be born with a weakness in the hiatus, or a weakness can develop over time. What increases the risk? This condition is more likely to develop in: Older people. Age is a major risk factor for a hiatal hernia, especially if you are over the age of 28. Pregnant women. People who are overweight. People who have frequent constipation. What are the signs or symptoms? Symptoms of this condition usually develop in the form of GERD symptoms. Symptoms include: Heartburn. Belching. Indigestion. Trouble swallowing. Coughing or wheezing. Sore throat. Hoarseness. Chest pain. Nausea and vomiting. How is this diagnosed? This condition may be diagnosed during testing for GERD. Tests that may be done include: X-rays of your stomach or chest. An upper gastrointestinal (GI) series. This is an X-ray exam of your GI tract that is taken after you swallow a chalky liquid that shows up clearly on the X-ray. Endoscopy. This is a procedure  to look into your stomach using a thin, flexible tube that has a tiny camera and light on the end of it. How is this treated? This condition may be treated by: Dietary and lifestyle changes to help reduce GERD symptoms. Medicines. These may include: Over-the-counter antacids. Medicines that make your stomach empty more quickly. Medicines that block  the production of stomach acid (H2 blockers). Stronger medicines to reduce stomach acid (proton pump inhibitors). Surgery to repair the hernia, if other treatments are not helping. If you have no symptoms, you may not need treatment. Follow these instructions at home: Lifestyle and activity Do not use any products that contain nicotine or tobacco, such as cigarettes and e-cigarettes. If you need help quitting, ask your health care provider. Try to achieve and maintain a healthy body weight. Avoid putting pressure on your abdomen. Anything that puts pressure on your abdomen increases the amount of acid that may be pushed up into your esophagus. Avoid bending over, especially after eating. Raise the head of your bed by putting blocks under the legs. This keeps your head and esophagus higher than your stomach. Do not wear tight clothing around your chest or stomach. Try not to strain when having a bowel movement, when urinating, or when lifting heavy objects. Eating and drinking Avoid foods that can worsen GERD symptoms. These may include: Fatty foods, like fried foods. Citrus fruits, like oranges or lemon. Other foods and drinks that contain acid, like orange juice or tomatoes. Spicy food. Chocolate. Eat frequent small meals instead of three large meals a day. This helps prevent your stomach from getting too full. Eat slowly. Do not lie down right after eating. Do not eat 1-2 hours before bed. Do not drink beverages with caffeine. These include cola, coffee, cocoa, and tea. Do not drink alcohol. General instructions Take over-the-counter and prescription medicines only as told by your health care provider. Keep all follow-up visits as told by your health care provider. This is important. Contact a health care provider if: Your symptoms are not controlled with medicines or lifestyle changes. You are having trouble swallowing. You have coughing or wheezing that will not go away. Get help  right away if: Your pain is getting worse. Your pain spreads to your arms, neck, jaw, teeth, or back. You have shortness of breath. You sweat for no reason. You feel sick to your stomach (nauseous) or you vomit. You vomit blood. You have bright red blood in your stools. You have black, tarry stools. Summary A hiatal hernia occurs when part of the stomach slides above the muscle that separates the abdomen from the chest (diaphragm). A person may be born with a weakness in the hiatus, or a weakness can develop over time. Symptoms of hiatal hernia may include heartburn, trouble swallowing, or sore throat. Management of hiatal hernia includes eating frequent small meals instead of three large meals a day. Get help right away if you vomit blood, have bright red blood in your stools, or have black, tarry stools. This information is not intended to replace advice given to you by your health care provider. Make sure you discuss any questions you have with your health care provider. Document Revised: 03/17/2020 Document Reviewed: 03/17/2020 Elsevier Patient Education  2022 [employer].  Fatty liver or Nonalcoholic fatty liver disease (NASH)  Now the leading cause of liver failure in the [place].  It is normally from such risk factors as obesity, diabetes, insulin resistance, high cholesterol, or metabolic syndrome.  The only  definitive therapy is weight loss and exercise.    Suggest walking 20-30 mins daily.  Decreasing carbohydrates, increasing veggies.    Fatty Liver Fatty liver is the accumulation of fat in liver cells. It is also called hepatosteatosis or steatohepatitis. It is normal for your liver to contain some fat. If fat is more than 5 to 10% of your liver's weight, you have fatty liver.  There are often no symptoms (problems) for years while damage is still occurring. People often learn about their fatty liver when they have medical tests for other reasons. Fat can damage  your liver for years or even decades without causing problems. When it becomes severe, it can cause fatigue, weight loss, weakness, and confusion. This makes you more likely to develop more serious liver problems. The liver is the largest organ in the body. It does a lot of work and often gives no warning signs when it is sick until late in a disease. The liver has many important jobs including: Breaking down foods. Storing vitamins, iron, and other minerals. Making proteins. Making bile for food digestion. Breaking down many products including medications, alcohol and some poisons.  PROGNOSIS  Fatty liver may cause no damage or it can lead to an inflammation of the liver. This is, called steatohepatitis.  Over time the liver may become scarred and hardened. This condition is called cirrhosis. Cirrhosis is serious and may lead to liver failure or cancer. NASH is one of the leading causes of cirrhosis. About 10-20% of Americans have fatty liver and a smaller 2-5% has NASH.  TREATMENT  Weight loss, fat restriction, and exercise in overweight patients produces inconsistent results but is worth trying. Good control of diabetes may reduce fatty liver. Eat a balanced, healthy diet. Increase your physical activity. There are no medical or surgical treatments for a fatty liver or NASH, but improving your diet and increasing your exercise may help prevent or reverse some of the damage.

## 2021-06-08 NOTE — Progress Notes (Signed)
Attending Physician's Attestation   I have reviewed the chart.   I agree with the Advanced Practitioner's note, impression, and recommendations with any updates as below.    Laryn Venning Mansouraty, MD [facility] Office # [phone]  

## 2022-02-15 DIAGNOSIS — J301 Allergic rhinitis due to pollen: Secondary | ICD-10-CM | POA: Diagnosis not present

## 2022-02-15 DIAGNOSIS — L299 Pruritus, unspecified: Secondary | ICD-10-CM | POA: Diagnosis not present

## 2022-02-15 DIAGNOSIS — J309 Allergic rhinitis, unspecified: Secondary | ICD-10-CM | POA: Diagnosis not present

## 2022-02-15 DIAGNOSIS — J3089 Other allergic rhinitis: Secondary | ICD-10-CM | POA: Diagnosis not present

## 2022-03-21 ENCOUNTER — Ambulatory Visit: Payer: 59 | Admitting: Obstetrics & Gynecology

## 2022-03-21 DIAGNOSIS — Z0289 Encounter for other administrative examinations: Secondary | ICD-10-CM

## 2022-04-18 ENCOUNTER — Ambulatory Visit (INDEPENDENT_AMBULATORY_CARE_PROVIDER_SITE_OTHER): Payer: 59

## 2022-04-18 ENCOUNTER — Encounter: Payer: Self-pay | Admitting: Orthopaedic Surgery

## 2022-04-18 ENCOUNTER — Ambulatory Visit (INDEPENDENT_AMBULATORY_CARE_PROVIDER_SITE_OTHER): Payer: 59 | Admitting: Orthopaedic Surgery

## 2022-04-18 DIAGNOSIS — R200 Anesthesia of skin: Secondary | ICD-10-CM | POA: Diagnosis not present

## 2022-04-18 DIAGNOSIS — M25572 Pain in left ankle and joints of left foot: Secondary | ICD-10-CM

## 2022-04-18 DIAGNOSIS — G8929 Other chronic pain: Secondary | ICD-10-CM | POA: Diagnosis not present

## 2022-04-18 DIAGNOSIS — M25511 Pain in right shoulder: Secondary | ICD-10-CM | POA: Diagnosis not present

## 2022-04-18 DIAGNOSIS — M79631 Pain in right forearm: Secondary | ICD-10-CM | POA: Insufficient documentation

## 2022-04-18 NOTE — Progress Notes (Signed)
Office Visit Note   Patient: Hannah Alvarez           Date of Birth: 1965/06/03           MRN: [id number] Visit Date: 04/18/2022              Requested by: Shirline Frees, MD [address] [place],  [place] [id number] PCP: Shirline Frees, MD   Assessment & Plan: Visit Diagnoses:  1. Chronic right shoulder pain   2. Pain in left ankle and joints of left foot   3. Right arm numbness     Plan: Hannah Alvarez presents today with a chief complaint of right shoulder pain.  She also is complaining of some numbness in her right forearm.  She also has lateral left foot pain since rolling her foot a few days ago.  With regards to her shoulder she has had subacromial injection in 2021 which helped her considerably.  She denies any new injury.  The numbness in her arm extends from the volar side of her elbow to her thumb.  With regards to her shoulder discussed since this has been an ongoing problem for over 2 years and she has had the return of her pain would recommend an MRI with follow-up afterwards.  Her neck x-rays are reassuring for any cervical cause for some of her numbness.  Exam has focal tingling just from the volar surface of the elbow to the wrist.  No indication of carpal tunnel pathology or de Quervain's.  With regards to her left foot she has no swelling just some tenderness some along the lateral side of the foot.  X-rays do not demonstrate any fracture.  The shoulder seems to be her most consistent and concerning problem.  She will obtain an MRI follow-up afterwards.  Probable soft tissue injury to the foot which hopefully will resolve without further treatment  Follow-Up Instructions: Return After MRI.   Orders:  Orders Placed This Encounter  Procedures   XR Foot Complete Left   XR Cervical Spine 2 or 3 views   MR SHOULDER RIGHT WO CONTRAST   No orders of the defined types were placed in this encounter.     Procedures: No procedures performed   Clinical  Data: No additional findings.   Subjective: Chief Complaint  Patient presents with   Right Arm - Numbness   Left Foot - Pain    HPI Hannah Alvarez is a pleasant 56 year old woman who comes in today with recurrent right shoulder pain.  She is also complaining of some numbness on the volar surface of her right arm.  Does not radiate into her hand or cause any strength concerns and also asking for evaluation of left lateral foot pain after rolling her foot over the weekend she did have an injection into her right shoulder a couple years ago which helped her symptoms but they have now returned  Review of Systems  All other systems reviewed and are negative.    Objective: Vital Signs: There were no vitals taken for this visit.  Physical Exam Constitutional:      Appearance: Normal appearance.  Pulmonary:     Effort: Pulmonary effort is normal.  Neurological:     General: No focal deficit present.     Mental Status: She is alert.     Ortho Exam Examination of her neck she has no pain with range of motion no reproduction of the symptoms in the right arm.  On her  right arm she is good grip strength in her hand she has normal sensation in her hand easily palpable radial pulse her hand is warm she is able to oppose her thumb to her fifth finger without any difficulty.  She does have some altered sensation from the elbow to her wrist on the volar side.  Again no swelling no redness no erythema.  Negative Finklestein's test and no specific pain in the first dorsal extensor compartment.  No pain about the elbow and negative Tinel over the ulnar radial and median nerves. Her right shoulder she has pain with forward elevation which she can do but is limited by pain.  Internal rotation behind her back is somewhat painful.  She does have positive impingement findings and some mild tenderness along the anterior and lateral subacromial region Specialty Comments:  No specialty comments  available.  Imaging: XR Cervical Spine 2 or 3 views  Result Date: 04/18/2022 2 views of her cervical spine were obtained today.  She has overall well-preserved joint spacing with very little degenerative changes.  She has good maintenance of the normal lordotic curve.  No evidence of any compression fractures or listhesis  XR Foot Complete Left  Result Date: 04/18/2022 Radiographs of her foot were taken today.  Well-maintained alignment through the midfoot.  No degenerative changes.  She does have an os peroneum. no evidence of fracture    PMFS History: Patient Active Problem List   Diagnosis Date Noted   Right forearm pain 04/18/2022   LUQ pain 08/05/2020   Dyspepsia 08/05/2020   LLQ pain 08/05/2020   Abdominal cramping 08/05/2020   History of colonic polyps 08/05/2020   Pain in right leg 04/06/2020   Pain in right shoulder 07/07/2019   Cervicalgia 07/07/2019   Essential hypertension 05/31/2014   Hypercholesteremia 05/31/2014   Elevated prolactin level 03/18/2013   HYDRONEPHROSIS 01/26/2008   ABDOMINAL PAIN, LEFT UPPER QUADRANT 12/22/2007   ESOPHAGEAL STRICTURE 07/29/2007   GERD 07/29/2007   Past Medical History:  Diagnosis Date   Constipation    Female circumcision    Headache(784.0)    migraines last one 3/50/09   History of Helicobacter pylori infection    Hyperlipemia    Hypertension    Incarcerated ventral hernia 08/28/2011   Repaired with mesh, 10/09/11    PONV (postoperative nausea and vomiting)    pt denies    Family History  Problem Relation Age of Onset   Hypertension Mother    Anesthesia problems Neg Hx    Colon cancer Neg Hx    Esophageal cancer Neg Hx    Inflammatory bowel disease Neg Hx    Liver disease Neg Hx    Pancreatic cancer Neg Hx    Rectal cancer Neg Hx    Stomach cancer Neg Hx     Past Surgical History:  Procedure Laterality Date   ANAL FISSURE REPAIR     CESAREAN SECTION  2002/2005   X 2   TONSILLECTOMY AND ADENOIDECTOMY  1987    VENTRAL HERNIA REPAIR  10/09/2011   Procedure: HERNIA REPAIR VENTRAL ADULT;  Surgeon: Haywood Lasso, MD;  Location: [facility];  Service: General;  Laterality: N/A;  Repair of chronic incarcerated ventral hernia and partial omentectomy with mesh.   Social History   Occupational History   Occupation: Ship broker  Tobacco Use   Smoking status: Never   Smokeless tobacco: Never  Vaping Use   Vaping Use: Never used  Substance and Sexual Activity   Alcohol use: No  Alcohol/week: 0.0 standard drinks of alcohol   Drug use: No   Sexual activity: Yes

## 2022-05-19 ENCOUNTER — Other Ambulatory Visit: Payer: 59

## 2022-05-21 ENCOUNTER — Other Ambulatory Visit: Payer: 59

## 2022-06-12 ENCOUNTER — Other Ambulatory Visit: Payer: Self-pay | Admitting: Physician Assistant

## 2022-06-12 DIAGNOSIS — R1013 Epigastric pain: Secondary | ICD-10-CM

## 2022-06-12 DIAGNOSIS — R1012 Left upper quadrant pain: Secondary | ICD-10-CM

## 2022-06-25 ENCOUNTER — Other Ambulatory Visit: Payer: Self-pay | Admitting: Gastroenterology

## 2022-06-25 DIAGNOSIS — R1012 Left upper quadrant pain: Secondary | ICD-10-CM

## 2022-06-25 DIAGNOSIS — R1013 Epigastric pain: Secondary | ICD-10-CM

## 2022-08-16 ENCOUNTER — Other Ambulatory Visit: Payer: Self-pay | Admitting: Gastroenterology

## 2022-08-16 DIAGNOSIS — R1013 Epigastric pain: Secondary | ICD-10-CM

## 2022-08-16 DIAGNOSIS — R1012 Left upper quadrant pain: Secondary | ICD-10-CM

## 2022-08-29 ENCOUNTER — Other Ambulatory Visit: Payer: Self-pay | Admitting: Gastroenterology

## 2022-08-29 DIAGNOSIS — R1013 Epigastric pain: Secondary | ICD-10-CM

## 2022-08-29 DIAGNOSIS — R1012 Left upper quadrant pain: Secondary | ICD-10-CM

## 2023-01-08 ENCOUNTER — Ambulatory Visit: Payer: BLUE CROSS/BLUE SHIELD | Admitting: Physical Therapy

## 2023-02-05 ENCOUNTER — Encounter (HOSPITAL_BASED_OUTPATIENT_CLINIC_OR_DEPARTMENT_OTHER): Payer: Self-pay | Admitting: Student

## 2023-02-05 ENCOUNTER — Ambulatory Visit (INDEPENDENT_AMBULATORY_CARE_PROVIDER_SITE_OTHER): Payer: BLUE CROSS/BLUE SHIELD

## 2023-02-05 ENCOUNTER — Ambulatory Visit (HOSPITAL_BASED_OUTPATIENT_CLINIC_OR_DEPARTMENT_OTHER): Payer: BLUE CROSS/BLUE SHIELD | Admitting: Student

## 2023-02-05 DIAGNOSIS — M84375A Stress fracture, left foot, initial encounter for fracture: Secondary | ICD-10-CM | POA: Diagnosis not present

## 2023-02-05 DIAGNOSIS — M25572 Pain in left ankle and joints of left foot: Secondary | ICD-10-CM

## 2023-02-05 NOTE — Progress Notes (Signed)
Chief Complaint: Left foot pain     History of Present Illness:    Hannah Alvarez is a 57 y.o. female presenting today for evaluation of left foot pain.  This began with an injury on July 17.  Patient was in [place] in [place] and was looking up while walking when her left foot turned inward causing her to fall.  She then was traveling to [place] where she was evaluated and placed in a cast for 3 weeks for an apparent fracture.  Upon arriving back in [place], she was seen by a sports medicine clinic and was referred to physical therapy after x-rays appear negative.  They also transitioned her into a lace up ankle brace.  Patient has been seen for 3 visits by physical therapy and has her last scheduled appointment today.  Pain and swelling of her foot has continued, which is moderate in severity.  This is worsened with walking, stairs, and being on her feet for long periods of time.  Has been taking Tylenol as she tries to stay away from NSAIDs due to prior stomach issues.  Surgical History:   None  PMH/PSH/Family History/Social History/Meds/Allergies:    Past Medical History:  Diagnosis Date   Constipation    Female circumcision    Headache(784.0)    migraines last one 09/28/11   History of Helicobacter pylori infection    Hyperlipemia    Hypertension    Incarcerated ventral hernia 08/28/2011   Repaired with mesh, 10/09/11    PONV (postoperative nausea and vomiting)    pt denies   Past Surgical History:  Procedure Laterality Date   ANAL FISSURE REPAIR     CESAREAN SECTION  2002/2005   X 2   TONSILLECTOMY AND ADENOIDECTOMY  1987   VENTRAL HERNIA REPAIR  10/09/2011   Procedure: HERNIA REPAIR VENTRAL ADULT;  Surgeon: Currie Paris, MD;  Location: [facility];  Service: General;  Laterality: N/A;  Repair of chronic incarcerated ventral hernia and partial omentectomy with mesh.   Social History   Socioeconomic History   Marital status:  Married    Spouse name: Not on file   Number of children: 2   Years of education: Not on file   Highest education level: Not on file  Occupational History   Occupation: student  Tobacco Use   Smoking status: Never   Smokeless tobacco: Never  Vaping Use   Vaping status: Never Used  Substance and Sexual Activity   Alcohol use: No    Alcohol/week: 0.0 standard drinks of alcohol   Drug use: No   Sexual activity: Yes  Other Topics Concern   Not on file  Social History Narrative   From [place]          Social Determinants of Health   Financial Resource Strain: Not on file  Food Insecurity: Not on file  Transportation Needs: Not on file  Physical Activity: Not on file  Stress: Not on file  Social Connections: Not on file   Family History  Problem Relation Age of Onset   Hypertension Mother    Anesthesia problems Neg Hx    Colon cancer Neg Hx    Esophageal cancer Neg Hx    Inflammatory bowel disease Neg Hx    Liver disease Neg Hx    Pancreatic cancer  Neg Hx    Rectal cancer Neg Hx    Stomach cancer Neg Hx    Allergies  Allergen Reactions   Amoxicillin-Pot Clavulanate     Other reaction(s): GI Upset (intolerance)   Aspirin Other (See Comments)    GI upset   Current Outpatient Medications  Medication Sig Dispense Refill   acetaminophen (TYLENOL) 325 MG tablet Take 650 mg by mouth every 6 (six) hours as needed for moderate pain or headache.     amLODipine (NORVASC) 2.5 MG tablet Take 2.5 mg by mouth daily.     Ascorbic Acid (VITAMIN C) 500 MG CAPS See admin instructions.     Cetirizine HCl (ZYRTEC PO) Take 1 tablet by mouth daily.     Cholecalciferol (VITAMIN D) 2000 UNITS CAPS Take by mouth.     desonide (DESOWEN) 0.05 % cream as directed.     dicyclomine (BENTYL) 10 MG capsule TAKE 1 CAPSULE BY MOUTH AS NEEDED FOR SPASMS 30 capsule 1   esomeprazole (NEXIUM) 40 MG capsule Take 40 mg by mouth daily.     hydrochlorothiazide (HYDRODIURIL) 25 MG tablet Take 1 tablet (25  mg total) by mouth daily. 30 tablet 0   Multiple Vitamin (MULTIVITAMIN) tablet Take 1 tablet by mouth daily.     SIMVASTATIN PO Take by mouth.     vitamin C (ASCORBIC ACID) 250 MG tablet Take 250 mg by mouth daily.     No current facility-administered medications for this visit.   No results found.  Review of Systems:   A ROS was performed including pertinent positives and negatives as documented in the HPI.  Physical Exam :   Constitutional: NAD and appears stated age Neurological: Alert and oriented Psych: Appropriate affect and cooperative Last menstrual period 06/15/2019.   Comprehensive Musculoskeletal Exam:    Soft tissue edema noted of the left ankle and midfoot.  Tenderness over the dorsal and lateral midfoot, particularly over the cuboid.  No fifth metatarsal tenderness.  Full passive and active range of motion of the ankle with plantarflexion and dorsiflexion.  DP pulse 2+.  Imaging:   Xray (left foot 3 views): Stress reaction of the lateral calcaneocuboid joint   I personally reviewed and interpreted the radiographs.   Assessment:   57 y.o. female with persistent left foot pain and swelling after an injury on July 17.  This continues to worsen as she is active on her feet.  X-rays do appear to show a stress reaction in the lateral foot at the calcaneocuboid joint.  I do believe a stress fracture is likely particularly given this is where she is most tender.  I will place her into a postop shoe today for added plantar support.  She can ice and elevate to help with swelling.  Will plan to see her back in 4 weeks for repeat x-ray and follow-up assessment.  Plan :    - Return to clinic in 4 weeks for reassessment     I personally saw and evaluated the patient, and participated in the management and treatment plan.  Hazle Nordmann, PA-C Orthopedics

## 2023-02-08 ENCOUNTER — Telehealth: Payer: Self-pay | Admitting: Student

## 2023-02-08 NOTE — Telephone Encounter (Signed)
Patient called. She would like a shoe for her R foot. Her call back number is [phone]

## 2023-02-08 NOTE — Telephone Encounter (Signed)
I called and talked to the pt. She was advised to get one for the other side from [facility]

## 2023-02-12 ENCOUNTER — Telehealth: Payer: Self-pay | Admitting: Student

## 2023-02-12 NOTE — Telephone Encounter (Signed)
Patient called and said that her feet is burning. She can't sleep at night. CB#[phone]

## 2023-02-13 NOTE — Telephone Encounter (Signed)
Lvm advising. Asked pt to return call to see what pharmacy she wants [place] to send that to

## 2023-02-13 NOTE — Telephone Encounter (Signed)
Pt returned call. Stated she isnt having pain but just burning. Per Jean Rosenthal pt was advised this was normal and to elevate and ice to get the swelling out of foot to reduce pressure to nerves. Pt stated understanding

## 2023-03-05 ENCOUNTER — Ambulatory Visit (HOSPITAL_BASED_OUTPATIENT_CLINIC_OR_DEPARTMENT_OTHER): Payer: BLUE CROSS/BLUE SHIELD | Admitting: Student

## 2023-03-06 ENCOUNTER — Ambulatory Visit (HOSPITAL_BASED_OUTPATIENT_CLINIC_OR_DEPARTMENT_OTHER): Payer: BLUE CROSS/BLUE SHIELD

## 2023-03-06 ENCOUNTER — Ambulatory Visit (HOSPITAL_BASED_OUTPATIENT_CLINIC_OR_DEPARTMENT_OTHER): Payer: BLUE CROSS/BLUE SHIELD | Admitting: Student

## 2023-03-06 ENCOUNTER — Encounter (HOSPITAL_BASED_OUTPATIENT_CLINIC_OR_DEPARTMENT_OTHER): Payer: Self-pay | Admitting: Student

## 2023-03-06 DIAGNOSIS — M84375A Stress fracture, left foot, initial encounter for fracture: Secondary | ICD-10-CM | POA: Diagnosis not present

## 2023-03-06 DIAGNOSIS — M25572 Pain in left ankle and joints of left foot: Secondary | ICD-10-CM | POA: Diagnosis not present

## 2023-03-06 NOTE — Progress Notes (Signed)
Chief Complaint: Left foot pain     History of Present Illness:   03/06/23: Patient presents today for 4-week follow-up evaluation of her left foot.  Overall states that she is doing much better.  After last visit she did begin developing some burning in the lateral plantar aspect of the foot however this has since improved.  Does note some pain around the front of the ankle and swelling on the lateral ankle.  She has been wearing postop shoes on both feet which have helped significantly.  Overall pain is a 3/10.   02/05/2023: Hannah Alvarez is a 57 y.o. female presenting today for evaluation of left foot pain.  This began with an injury on July 17.  Patient was in [place] in [place] and was looking up while walking when her left foot turned inward causing her to fall.  She then was traveling to [place] where she was evaluated and placed in a cast for 3 weeks for an apparent fracture.  Upon arriving back in [place], she was seen by a sports medicine clinic and was referred to physical therapy after x-rays appear negative.  They also transitioned her into a lace up ankle brace.  Patient has been seen for 3 visits by physical therapy and has her last scheduled appointment today.  Pain and swelling of her foot has continued, which is moderate in severity.  This is worsened with walking, stairs, and being on her feet for long periods of time.  Has been taking Tylenol as she tries to stay away from NSAIDs due to prior stomach issues.  Surgical History:   None  PMH/PSH/Family History/Social History/Meds/Allergies:    Past Medical History:  Diagnosis Date   Constipation    Female circumcision    Headache(784.0)    migraines last one 09/28/11   History of Helicobacter pylori infection    Hyperlipemia    Hypertension    Incarcerated ventral hernia 08/28/2011   Repaired with mesh, 10/09/11    PONV (postoperative nausea and vomiting)    pt denies   Past  Surgical History:  Procedure Laterality Date   ANAL FISSURE REPAIR     CESAREAN SECTION  2002/2005   X 2   TONSILLECTOMY AND ADENOIDECTOMY  1987   VENTRAL HERNIA REPAIR  10/09/2011   Procedure: HERNIA REPAIR VENTRAL ADULT;  Surgeon: Currie Paris, MD;  Location: [facility] OR;  Service: General;  Laterality: N/A;  Repair of chronic incarcerated ventral hernia and partial omentectomy with mesh.   Social History   Socioeconomic History   Marital status: Married    Spouse name: Not on file   Number of children: 2   Years of education: Not on file   Highest education level: Not on file  Occupational History   Occupation: student  Tobacco Use   Smoking status: Never   Smokeless tobacco: Never  Vaping Use   Vaping status: Never Used  Substance and Sexual Activity   Alcohol use: No    Alcohol/week: 0.0 standard drinks of alcohol   Drug use: No   Sexual activity: Yes  Other Topics Concern   Not on file  Social History Narrative   From [place]          Social Determinants of Health   Financial Resource Strain: Not on file  Food Insecurity: Not on file  Transportation Needs: Not on file  Physical Activity: Not on file  Stress: Not on file  Social Connections: Not on file   Family History  Problem Relation Age of Onset   Hypertension Mother    Anesthesia problems Neg Hx    Colon cancer Neg Hx    Esophageal cancer Neg Hx    Inflammatory bowel disease Neg Hx    Liver disease Neg Hx    Pancreatic cancer Neg Hx    Rectal cancer Neg Hx    Stomach cancer Neg Hx    Allergies  Allergen Reactions   Amoxicillin-Pot Clavulanate     Other reaction(s): GI Upset (intolerance)   Aspirin Other (See Comments)    GI upset   Current Outpatient Medications  Medication Sig Dispense Refill   acetaminophen (TYLENOL) 325 MG tablet Take 650 mg by mouth every 6 (six) hours as needed for moderate pain or headache.     amLODipine (NORVASC) 2.5 MG tablet Take 2.5 mg by mouth daily.     Ascorbic  Acid (VITAMIN C) 500 MG CAPS See admin instructions.     Cetirizine HCl (ZYRTEC PO) Take 1 tablet by mouth daily.     Cholecalciferol (VITAMIN D) 2000 UNITS CAPS Take by mouth.     desonide (DESOWEN) 0.05 % cream as directed.     dicyclomine (BENTYL) 10 MG capsule TAKE 1 CAPSULE BY MOUTH AS NEEDED FOR SPASMS 30 capsule 1   esomeprazole (NEXIUM) 40 MG capsule Take 40 mg by mouth daily.     hydrochlorothiazide (HYDRODIURIL) 25 MG tablet Take 1 tablet (25 mg total) by mouth daily. 30 tablet 0   Multiple Vitamin (MULTIVITAMIN) tablet Take 1 tablet by mouth daily.     SIMVASTATIN PO Take by mouth.     vitamin C (ASCORBIC ACID) 250 MG tablet Take 250 mg by mouth daily.     No current facility-administered medications for this visit.   No results found.  Review of Systems:   A ROS was performed including pertinent positives and negatives as documented in the HPI.  Physical Exam :   Constitutional: NAD and appears stated age Neurological: Alert and oriented Psych: Appropriate affect and cooperative Last menstrual period 06/15/2019.   Comprehensive Musculoskeletal Exam:    Mild edema noted over the lateral left ankle.  Active range of motion of the ankle to 20 degrees dorsiflexion 40 degrees plantarflexion.  No point tenderness throughout the foot or ankle.  Small dime sized area of ecchymosis noted on the lateral plantar foot.  Dorsalis pedis pulse is 2+.  Imaging:   Xray (left foot 3 views): Os peroneum present.  Mild degenerative changes in the midfoot.  Posterior calcaneal osteophyte.   I personally reviewed and interpreted the radiographs.   Assessment:   57 y.o. female with chronic left foot pain from an injury 4 months ago.  She has been in a postop shoe for the past 4 weeks and has gotten good improvement from this.  Pain in the lateral foot over the os peroneum has seemingly resolved.  I do believe the burning in her foot is a result of the postop shoe.  I would like her to begin  progressing back into normal shoes as tolerated.  Can begin resuming activities as long as pain is well-controlled.  Recommend ice for lateral ankle swelling.  She can plan to return as needed.  Plan :    - Return to clinic as needed  I personally saw and evaluated the patient, and participated in the management and treatment plan.  Hazle Nordmann, PA-C Orthopedics

## 2023-07-12 ENCOUNTER — Ambulatory Visit: Payer: BLUE CROSS/BLUE SHIELD | Admitting: Family Medicine

## 2024-03-02 ENCOUNTER — Encounter: Payer: Self-pay | Admitting: Radiology
